# Patient Record
Sex: Male | Born: 1970
Health system: Southern US, Community
[De-identification: ages and names within clinical notes are randomized; demographics above are authoritative.]

## PROBLEM LIST (undated history)

## (undated) DIAGNOSIS — Z8719 Personal history of other diseases of the digestive system: Secondary | ICD-10-CM

## (undated) DIAGNOSIS — K859 Acute pancreatitis without necrosis or infection, unspecified: Secondary | ICD-10-CM

## (undated) HISTORY — DX: Acute pancreatitis without necrosis or infection, unspecified: K85.90

## (undated) HISTORY — PX: HERNIA REPAIR: SHX51

---

## 2015-08-22 ENCOUNTER — Encounter (HOSPITAL_COMMUNITY)
Admission: RE | Admit: 2015-08-22 | Discharge: 2015-08-22 | Disposition: A | Discharge status: Home / Self Care | Payer: Medicare Other | Source: Ambulatory Visit | Attending: Oral Surgery | Admitting: Oral Surgery

## 2015-08-22 ENCOUNTER — Encounter (HOSPITAL_COMMUNITY): Payer: Self-pay

## 2015-08-22 DIAGNOSIS — K0530 Chronic periodontitis, unspecified: Secondary | ICD-10-CM | POA: Insufficient documentation

## 2015-08-22 DIAGNOSIS — Z01812 Encounter for preprocedural laboratory examination: Secondary | ICD-10-CM | POA: Insufficient documentation

## 2015-08-22 DIAGNOSIS — K029 Dental caries, unspecified: Secondary | ICD-10-CM | POA: Diagnosis not present

## 2015-08-22 HISTORY — DX: Personal history of other diseases of the digestive system: Z87.19

## 2015-08-22 LAB — CBC
HEMATOCRIT: 43.1 % (ref 39.0–52.0)
Hemoglobin: 14.6 g/dL (ref 13.0–17.0)
MCH: 30.9 pg (ref 26.0–34.0)
MCHC: 33.9 g/dL (ref 30.0–36.0)
MCV: 91.3 fL (ref 78.0–100.0)
PLATELETS: 210 10*3/uL (ref 150–400)
RBC: 4.72 MIL/uL (ref 4.22–5.81)
RDW: 13.5 % (ref 11.5–15.5)
WBC: 8.1 10*3/uL (ref 4.0–10.5)

## 2015-08-22 NOTE — H&P (Signed)
HISTORY AND PHYSICAL  Rick Velazquez is a 44 y.o. male patient with CC: painful teeth.  No diagnosis found.  Past Medical History  Diagnosis Date  . History of hiatal hernia     No current facility-administered medications for this encounter.   Current Outpatient Prescriptions  Medication Sig Dispense Refill  . acetaminophen (TYLENOL) 500 MG tablet Take 1,000 mg by mouth every 6 (six) hours as needed for headache.     No Known Allergies Active Problems:   * No active hospital problems. *  Vitals: There were no vitals taken for this visit. Lab results: Results for orders placed or performed during the hospital encounter of 08/22/15 (from the past 24 hour(s))  CBC     Status: None   Collection Time: 08/22/15  9:12 AM  Result Value Ref Range   WBC 8.1 4.0 - 10.5 K/uL   RBC 4.72 4.22 - 5.81 MIL/uL   Hemoglobin 14.6 13.0 - 17.0 g/dL   HCT 40.9 81.1 - 91.4 %   MCV 91.3 78.0 - 100.0 fL   MCH 30.9 26.0 - 34.0 pg   MCHC 33.9 30.0 - 36.0 g/dL   RDW 78.2 95.6 - 21.3 %   Platelets 210 150 - 400 K/uL   Radiology Results: No results found. General appearance: alert and no distress Head: Normocephalic, without obvious abnormality, atraumatic Eyes: negative Nose: Nares normal. Septum midline. Mucosa normal. No drainage or sinus tenderness. Throat: Gross decay, gross calculus. Pharynx clear Neck: no adenopathy, supple, symmetrical, trachea midline and thyroid not enlarged, symmetric, no tenderness/mass/nodules Resp: clear to auscultation bilaterally Cardio: regular rate and rhythm, S1, S2 normal, no murmur, click, rub or gallop  Assessment:All teeth nonrestorable secondary to dental caries and chronic generalized periodontitis  Plan: Full mouth extractions. Alveoloplasty. General anesthesia. Day surgery.   Georgia Lopes 08/22/2015

## 2015-08-25 ENCOUNTER — Ambulatory Visit (HOSPITAL_COMMUNITY)
Admission: RE | Admit: 2015-08-25 | Discharge: 2015-08-25 | Disposition: A | Discharge status: Home / Self Care | Payer: Medicare Other | Source: Ambulatory Visit | Attending: Oral Surgery | Admitting: Oral Surgery

## 2015-08-25 ENCOUNTER — Encounter (HOSPITAL_COMMUNITY): Payer: Self-pay | Admitting: *Deleted

## 2015-08-25 ENCOUNTER — Ambulatory Visit (HOSPITAL_COMMUNITY): Payer: Medicare Other | Admitting: Anesthesiology

## 2015-08-25 ENCOUNTER — Encounter (HOSPITAL_COMMUNITY)
Admission: RE | Disposition: A | Discharge status: Home / Self Care | Payer: Self-pay | Source: Ambulatory Visit | Attending: Oral Surgery

## 2015-08-25 DIAGNOSIS — F17200 Nicotine dependence, unspecified, uncomplicated: Secondary | ICD-10-CM | POA: Diagnosis not present

## 2015-08-25 DIAGNOSIS — K029 Dental caries, unspecified: Secondary | ICD-10-CM | POA: Diagnosis not present

## 2015-08-25 DIAGNOSIS — K0532 Chronic periodontitis, generalized: Secondary | ICD-10-CM | POA: Insufficient documentation

## 2015-08-25 DIAGNOSIS — K449 Diaphragmatic hernia without obstruction or gangrene: Secondary | ICD-10-CM | POA: Diagnosis not present

## 2015-08-25 HISTORY — PX: TOOTH EXTRACTION: SHX859

## 2015-08-25 HISTORY — PX: ALVEOLOPLASTY: SHX5710

## 2015-08-25 SURGERY — DENTAL RESTORATION/EXTRACTIONS
Anesthesia: General | Site: Mouth

## 2015-08-25 MED ORDER — FENTANYL CITRATE (PF) 100 MCG/2ML IJ SOLN: 25.0000 ug | INTRAMUSCULAR | Status: DC | PRN

## 2015-08-25 MED ORDER — ONDANSETRON HCL 4 MG/2ML IJ SOLN
INTRAMUSCULAR | Status: DC | PRN
  Administered 2015-08-25: 4 mg via INTRAVENOUS

## 2015-08-25 MED ORDER — PHENYLEPHRINE HCL 10 MG/ML IJ SOLN
INTRAMUSCULAR | Status: DC | PRN
  Administered 2015-08-25: 80 ug via INTRAVENOUS
  Administered 2015-08-25: 120 ug via INTRAVENOUS
  Administered 2015-08-25: 80 ug via INTRAVENOUS

## 2015-08-25 MED ORDER — FENTANYL CITRATE (PF) 250 MCG/5ML IJ SOLN
INTRAMUSCULAR | Status: AC
  Filled 2015-08-25: qty 5

## 2015-08-25 MED ORDER — SUGAMMADEX SODIUM 200 MG/2ML IV SOLN
INTRAVENOUS | Status: DC | PRN
  Administered 2015-08-25: 200 mg via INTRAVENOUS

## 2015-08-25 MED ORDER — SUGAMMADEX SODIUM 200 MG/2ML IV SOLN
INTRAVENOUS | Status: AC
  Filled 2015-08-25: qty 2

## 2015-08-25 MED ORDER — LACTATED RINGERS IV SOLN
INTRAVENOUS | Status: DC
  Administered 2015-08-25: 50 mL/h via INTRAVENOUS

## 2015-08-25 MED ORDER — MIDAZOLAM HCL 5 MG/5ML IJ SOLN
INTRAMUSCULAR | Status: DC | PRN
  Administered 2015-08-25: 2 mg via INTRAVENOUS

## 2015-08-25 MED ORDER — PROPOFOL 10 MG/ML IV BOLUS
INTRAVENOUS | Status: DC | PRN
  Administered 2015-08-25: 200 mg via INTRAVENOUS

## 2015-08-25 MED ORDER — MIDAZOLAM HCL 2 MG/2ML IJ SOLN
INTRAMUSCULAR | Status: AC
  Filled 2015-08-25: qty 4

## 2015-08-25 MED ORDER — ROCURONIUM BROMIDE 50 MG/5ML IV SOLN
INTRAVENOUS | Status: AC
  Filled 2015-08-25: qty 1

## 2015-08-25 MED ORDER — CEFAZOLIN SODIUM-DEXTROSE 2-3 GM-% IV SOLR
INTRAVENOUS | Status: AC
  Administered 2015-08-25: 2 g via INTRAVENOUS
  Filled 2015-08-25: qty 50

## 2015-08-25 MED ORDER — PHENYLEPHRINE 40 MCG/ML (10ML) SYRINGE FOR IV PUSH (FOR BLOOD PRESSURE SUPPORT)
PREFILLED_SYRINGE | INTRAVENOUS | Status: AC
  Filled 2015-08-25: qty 10

## 2015-08-25 MED ORDER — LIDOCAINE HCL (CARDIAC) 20 MG/ML IV SOLN
INTRAVENOUS | Status: DC | PRN
  Administered 2015-08-25: 50 mg via INTRAVENOUS

## 2015-08-25 MED ORDER — ROCURONIUM BROMIDE 100 MG/10ML IV SOLN
INTRAVENOUS | Status: DC | PRN
  Administered 2015-08-25: 40 mg via INTRAVENOUS

## 2015-08-25 MED ORDER — 0.9 % SODIUM CHLORIDE (POUR BTL) OPTIME
TOPICAL | Status: DC | PRN
  Administered 2015-08-25: 1000 mL

## 2015-08-25 MED ORDER — CEFAZOLIN SODIUM-DEXTROSE 2-3 GM-% IV SOLR: 2.0000 g | INTRAVENOUS | Status: DC

## 2015-08-25 MED ORDER — SODIUM CHLORIDE 0.9 % IR SOLN
Status: DC | PRN
  Administered 2015-08-25: 1000 mL

## 2015-08-25 MED ORDER — ARTIFICIAL TEARS OP OINT
TOPICAL_OINTMENT | OPHTHALMIC | Status: DC | PRN
  Administered 2015-08-25: 1 via OPHTHALMIC

## 2015-08-25 MED ORDER — OXYCODONE-ACETAMINOPHEN 5-325 MG PO TABS: 1.0000 | ORAL_TABLET | ORAL | Status: DC | PRN

## 2015-08-25 MED ORDER — ROCURONIUM BROMIDE 50 MG/5ML IV SOLN
INTRAVENOUS | Status: AC
  Filled 2015-08-25: qty 2

## 2015-08-25 MED ORDER — LACTATED RINGERS IV SOLN
INTRAVENOUS | Status: DC | PRN
  Administered 2015-08-25 (×2): via INTRAVENOUS

## 2015-08-25 MED ORDER — ONDANSETRON HCL 4 MG/2ML IJ SOLN: 4.0000 mg | Freq: Once | INTRAMUSCULAR | Status: DC | PRN

## 2015-08-25 MED ORDER — FENTANYL CITRATE (PF) 100 MCG/2ML IJ SOLN
INTRAMUSCULAR | Status: DC | PRN
  Administered 2015-08-25: 100 ug via INTRAVENOUS
  Administered 2015-08-25: 50 ug via INTRAVENOUS

## 2015-08-25 MED ORDER — LIDOCAINE-EPINEPHRINE 2 %-1:100000 IJ SOLN
INTRAMUSCULAR | Status: DC | PRN
  Administered 2015-08-25: 20 mL via INTRADERMAL

## 2015-08-25 MED ORDER — PROPOFOL 10 MG/ML IV BOLUS
INTRAVENOUS | Status: AC
  Filled 2015-08-25: qty 20

## 2015-08-25 MED ORDER — LIDOCAINE-EPINEPHRINE 2 %-1:100000 IJ SOLN
INTRAMUSCULAR | Status: AC
  Filled 2015-08-25: qty 1

## 2015-08-25 SURGICAL SUPPLY — 29 items
BLADE 10 SAFETY STRL DISP (BLADE) IMPLANT
BUR CROSS CUT FISSURE 1.6 (BURR) ×2 IMPLANT
BUR CROSS CUT FISSURE 1.6MM (BURR) ×1
BUR EGG ELITE 4.0 (BURR) ×2 IMPLANT
BUR EGG ELITE 4.0MM (BURR) ×1
CANISTER SUCTION 2500CC (MISCELLANEOUS) ×3 IMPLANT
COVER SURGICAL LIGHT HANDLE (MISCELLANEOUS) ×3 IMPLANT
GAUZE PACKING FOLDED 2  STR (GAUZE/BANDAGES/DRESSINGS) ×2
GAUZE PACKING FOLDED 2 STR (GAUZE/BANDAGES/DRESSINGS) ×1 IMPLANT
GLOVE BIO SURGEON STRL SZ 6.5 (GLOVE) ×2 IMPLANT
GLOVE BIO SURGEON STRL SZ7.5 (GLOVE) ×3 IMPLANT
GLOVE BIO SURGEONS STRL SZ 6.5 (GLOVE) ×1
GLOVE BIOGEL PI IND STRL 7.0 (GLOVE) ×1 IMPLANT
GLOVE BIOGEL PI INDICATOR 7.0 (GLOVE) ×2
GOWN STRL REUS W/ TWL LRG LVL3 (GOWN DISPOSABLE) ×1 IMPLANT
GOWN STRL REUS W/ TWL XL LVL3 (GOWN DISPOSABLE) ×1 IMPLANT
GOWN STRL REUS W/TWL LRG LVL3 (GOWN DISPOSABLE) ×2
GOWN STRL REUS W/TWL XL LVL3 (GOWN DISPOSABLE) ×2
KIT BASIN OR (CUSTOM PROCEDURE TRAY) ×3 IMPLANT
KIT ROOM TURNOVER OR (KITS) ×3 IMPLANT
NEEDLE 22X1 1/2 (OR ONLY) (NEEDLE) ×6 IMPLANT
NS IRRIG 1000ML POUR BTL (IV SOLUTION) ×3 IMPLANT
PAD ARMBOARD 7.5X6 YLW CONV (MISCELLANEOUS) ×3 IMPLANT
SPONGE SURGIFOAM ABS GEL 12-7 (HEMOSTASIS) IMPLANT
SUT CHROMIC 3 0 PS 2 (SUTURE) ×6 IMPLANT
TOWEL OR 17X24 6PK STRL BLUE (TOWEL DISPOSABLE) IMPLANT
TRAY ENT MC OR (CUSTOM PROCEDURE TRAY) ×3 IMPLANT
TUBING IRRIGATION (MISCELLANEOUS) ×3 IMPLANT
YANKAUER SUCT BULB TIP NO VENT (SUCTIONS) ×3 IMPLANT

## 2015-08-25 NOTE — Anesthesia Preprocedure Evaluation (Addendum)
Anesthesia Evaluation  Patient identified by MRN, date of birth, ID band Patient awake    Reviewed: Allergy & Precautions, NPO status , Patient's Chart, lab work & pertinent test results  Airway Mallampati: II  TM Distance: >3 FB Neck ROM: Full    Dental  (+) Poor Dentition, Dental Advisory Given   Pulmonary Current Smoker,    breath sounds clear to auscultation       Cardiovascular  Rhythm:Regular Rate:Normal     Neuro/Psych    GI/Hepatic hiatal hernia,   Endo/Other    Renal/GU      Musculoskeletal   Abdominal   Peds  Hematology   Anesthesia Other Findings   Reproductive/Obstetrics                            Anesthesia Physical Anesthesia Plan  ASA: II  Anesthesia Plan: General   Post-op Pain Management:    Induction: Intravenous  Airway Management Planned: Nasal ETT  Additional Equipment:   Intra-op Plan:   Post-operative Plan:   Informed Consent: I have reviewed the patients History and Physical, chart, labs and discussed the procedure including the risks, benefits and alternatives for the proposed anesthesia with the patient or authorized representative who has indicated his/her understanding and acceptance.   Dental advisory given and Dental Advisory Given  Plan Discussed with: CRNA and Anesthesiologist  Anesthesia Plan Comments: (Smoker Poor dentition  Plan GA with nasal ETT  Kipp Brood)       Anesthesia Quick Evaluation

## 2015-08-25 NOTE — Op Note (Signed)
08/25/2015  12:08 PM  PATIENT:  Valerie Salts  44 y.o. male  PRE-OPERATIVE DIAGNOSIS:  NON RESTORABLE TEETH #'s 1, 2, 4, 5, 6, 8, 9, 10, 11, 12, 16, 18, 20, 21, 22, 26, 27, 28, 29, 30  POST-OPERATIVE DIAGNOSIS:  SAME  PROCEDURE:  Procedure(s): MULTIPLE EXTRACTIONS TEETH #'s 1, 2, 4, 5, 6, 8, 9, 10, 11, 12, 16, 18, 20, 21, 22, 26, 27, 28, 29, 30; ALVEOLOPLASTY MAXILLA AND MANDIBLE  SURGEON:  Surgeon(s): Ocie Doyne, DDS  ANESTHESIA:   local and general  EBL:  minimal  DRAINS: none   SPECIMEN:  No Specimen  COUNTS:  YES  PLAN OF CARE: Discharge to home after PACU  PATIENT DISPOSITION:  PACU - hemodynamically stable.   PROCEDURE DETAILS: Dictation # [id number]  Georgia Lopes, DMD 08/25/2015 12:08 PM

## 2015-08-25 NOTE — Anesthesia Procedure Notes (Signed)
Procedure Name: Intubation Date/Time: 08/25/2015 11:26 AM Performed by: Carmela Rima Pre-anesthesia Checklist: Patient being monitored, Suction available, Emergency Drugs available, Patient identified and Timeout performed Patient Re-evaluated:Patient Re-evaluated prior to inductionOxygen Delivery Method: Circle system utilized Preoxygenation: Pre-oxygenation with 100% oxygen Intubation Type: IV induction Ventilation: Mask ventilation without difficulty Laryngoscope Size: Mac and 4 Grade View: Grade II Nasal Tubes: Right, Nasal prep performed, Nasal Rae and Magill forceps- large, utilized Tube size: 7.5 mm Number of attempts: 1 Placement Confirmation: positive ETCO2,  ETT inserted through vocal cords under direct vision and breath sounds checked- equal and bilateral Tube secured with: Tape Dental Injury: Teeth and Oropharynx as per pre-operative assessment

## 2015-08-25 NOTE — Transfer of Care (Signed)
Immediate Anesthesia Transfer of Care Note  Patient: Rick Velazquez  Procedure(s) Performed: Procedure(s): MULTIPLE EXTRACTIONS OF TEETH  NUMBERS ONE, TWO, FOUR, FIVE, SIX, EIGHT, NINE, TEN, ELEVEN, TWELVE, SIXTEEN, EIGHTEEN, TWENTY, TWENTY-ONE, TWENTY-TWO, TWENTY-SIX, TWENTY-SEVEN, TWENTY-EIGHT, TWENTY-NINE, THIRTY (N/A) ALVEOLOPLASTY (N/A)  Patient Location: PACU  Anesthesia Type:General  Level of Consciousness: awake, alert  and oriented  Airway & Oxygen Therapy: Patient Spontanous Breathing and Patient connected to face mask oxygen  Post-op Assessment: Report given to RN, Post -op Vital signs reviewed and stable and Patient moving all extremities X 4  Post vital signs: Reviewed and stable  Last Vitals:  Filed Vitals:   08/25/15 0844  BP: 130/85  Pulse: 73  Temp: 36.5 C  Resp: 20    Complications: No apparent anesthesia complications

## 2015-08-25 NOTE — H&P (Signed)
HISTORY AND PHYSICAL  Rick Velazquez is a 44 y.o. male patient with CC: Painful teeth. Referred by general dentist for full mouth extractions in preparation for dentures.  No diagnosis found.  Past Medical History  Diagnosis Date  . History of hiatal hernia     Current Facility-Administered Medications  Medication Dose Route Frequency Provider Last Rate Last Dose  . ceFAZolin (ANCEF) 2-3 GM-% IVPB SOLR           . ceFAZolin (ANCEF) IVPB 2 g/50 mL premix  2 g Intravenous To SS-Surg Ocie Doyne, DDS      . lactated ringers infusion   Intravenous Continuous Kipp Brood, MD 50 mL/hr at 08/25/15 0942 50 mL/hr at 08/25/15 9604   Facility-Administered Medications Ordered in Other Encounters  Medication Dose Route Frequency Provider Last Rate Last Dose  . lactated ringers infusion    Continuous PRN Carmela Rima, CRNA       No Known Allergies Active Problems:   * No active hospital problems. *  Vitals: Blood pressure 130/85, pulse 73, temperature 97.7 F (36.5 C), temperature source Oral, resp. rate 20, height  (1.753 Velazquez), weight 80.468 kg (177 lb 6.4 oz), SpO2 100 %. Lab results:No results found for this or any previous visit (from the past 24 hour(s)). Radiology Results: No results found. General appearance: alert, cooperative and no distress Head: Normocephalic, without obvious abnormality, atraumatic Eyes: negative Nose: Nares normal. Septum midline. Mucosa normal. No drainage or sinus tenderness. Throat: grossly decayed remaining teeth. No oral lesions. No trismus. Pharynx clear. Neck: no adenopathy, supple, symmetrical, trachea midline and thyroid not enlarged, symmetric, no tenderness/mass/nodules Resp: clear to auscultation bilaterally Cardio: regular rate and rhythm, S1, S2 normal, no murmur, click, rub or gallop  Assessment: Nonrestorable teeth secondary to dental caries, periodontitis  Plan: full mouth extractions with alveoloplasty. General anesthesia. Day  surgery.   Rick Velazquez 08/25/2015

## 2015-08-25 NOTE — Anesthesia Postprocedure Evaluation (Signed)
  Anesthesia Post-op Note  Patient: Rick Velazquez  Procedure(s) Performed: Procedure(s): MULTIPLE EXTRACTIONS OF TEETH  NUMBERS ONE, TWO, FOUR, FIVE, SIX, EIGHT, NINE, TEN, ELEVEN, TWELVE, SIXTEEN, EIGHTEEN, TWENTY, TWENTY-ONE, TWENTY-TWO, TWENTY-SIX, TWENTY-SEVEN, TWENTY-EIGHT, TWENTY-NINE, THIRTY (N/A) ALVEOLOPLASTY (N/A)  Patient Location: PACU  Anesthesia Type:General  Level of Consciousness: awake, alert  and oriented  Airway and Oxygen Therapy: Patient Spontanous Breathing and Patient connected to nasal cannula oxygen  Post-op Pain: mild  Post-op Assessment: Post-op Vital signs reviewed, Patient's Cardiovascular Status Stable, Respiratory Function Stable, Patent Airway and Pain level controlled              Post-op Vital Signs: stable  Last Vitals:  Filed Vitals:   08/25/15 1330  BP: 138/88  Pulse: 85  Temp: 36.8 C  Resp: 22    Complications: No apparent anesthesia complications

## 2015-08-27 ENCOUNTER — Encounter (HOSPITAL_COMMUNITY): Payer: Self-pay | Admitting: Oral Surgery

## 2015-08-28 NOTE — Op Note (Signed)
NAME:  Rick Velazquez, Rick Velazquez NO.:  [id number]  MEDICAL RECORD NO.:  [id number]  LOCATION:  [facility]                         FACILITY:  [facility]  PHYSICIAN:  Georgia Lopes, M.D.  DATE OF BIRTH:  30-Apr-1971  DATE OF PROCEDURE:  08/25/2015 DATE OF DISCHARGE:  08/25/2015                              OPERATIVE REPORT   PREOPERATIVE DIAGNOSIS:  Nonrestorable teeth secondary to dental caries in periodontitis, #1, 2, 4, 5, 6, 8, 9, 10, 11, 12, 16, 18, 20, 21, 22, 26, 27, 28, 29, 30.  POSTOPERATIVE DIAGNOSIS:  Nonrestorable teeth secondary to dental caries in periodontitis, #1, 2, 4, 5, 6, 8, 9, 10, 11, 12, 16, 18, 20, 21, 22, 26, 27, 28, 29, 30.  PROCEDURE:  Multiple extractions teeth numbers 1, 2, 4, 6, 8, 9, 10, 11, 12, 16, 18, 20, 21, 22, 26, 27, 28, 29, 30, alveoplasty maxilla and mandible.  SURGEON:  Georgia Lopes, MD  ANESTHESIA:  General.  ATTENDING:  Georgia Lopes, MD  ANESTHESIA:  Nasal intubation.  PROCEDURE IN DETAIL:  The patient was taken to the operating room, placed on the table in supine position.  General anesthesia was administered intravenously and a nasal endotracheal tube was placed and secured.  The eyes were protected and the patient was draped.  Time-out was performed.  Posterior pharynx was suctioned.  A throat pack was placed.  A 2% lidocaine with 1:100,000 epinephrine was infiltrated in the inferior alveolar block on the right and left side and then buccal and palatal infiltration in the maxilla.  Total of 20 mL was utilized. A bite block was placed in the right side of the mouth and a Sweetheart retractor was used to retract the tongue.  A 15 blade was used to make an incision beginning at tooth #18 carrying forward along the crest of the mandibular bridge and then going buccally and lingually around teeth #20, 21, 22, and then crossing midline until #26 was encountered.  In the maxilla, a 15 blade was used to make an incision beginning  around tooth #16, carrying forward along the alveolar crest around teeth #12, 11, 10, 9, 8, and 6 on the buccal and palatal aspects of these teeth. The periosteum was reflected in the maxilla and mandible left side with a periosteal elevator.  The teeth were elevated with a 301 elevator and removed from the mouth with a dental forceps.  The sockets were curetted and then the periosteum was reflected and the gingiva was trimmed to allow for primary closure and then the alveolar crest was exposed in the maxilla and mandible on the left side.  Then, the alveoplasty was performed using the egg-shaped bur and bone file.  Then, the areas were irrigated and closed with 3-0 chromic.  A larger bite block was placed on the left side of the mouth and a Sweetheart retractor was repositioned to retract the tongue and then a 15 blade was used to make an incision beginning at tooth #30 and carried forward to tooth #27 on the buccal and lingual aspects.  Proximal incision was made approximately 1.5 cm to the additional tooth #30 and then in the maxilla, a 15 blade  was used to make an incision buccally and palatally around teeth #1, 2, 4, 5, and 6.  The periosteum was reflected from and around these teeth with a periosteal elevator.  The teeth were elevated and removed from the mouth with the dental forceps.  The sockets were curetted and then the periosteum was reflected after the gingiva was trimmed and alveoplasty was performed using the egg-shaped bur and bone file.  The areas were irrigated and closed with 3-0 chromic.  The oral cavity was inspected and found to have good contour, hemostasis, and closure.  The oral cavity was irrigated and suctioned.  Throat pack was removed.  The patient was awakened, taken to the recovery room, breathing spontaneously in good condition.  EBL:  Minimal.  COMPLICATIONS:  None.  SPECIMENS:  None.     Georgia Lopes, M.D.     SMJ/MEDQ  D:  08/25/2015  T:   08/26/2015  Job:  [id number]

## 2017-10-21 ENCOUNTER — Other Ambulatory Visit: Payer: Self-pay

## 2017-10-21 ENCOUNTER — Inpatient Hospital Stay (HOSPITAL_COMMUNITY)
Admission: EM | Admit: 2017-10-21 | Discharge: 2017-10-27 | DRG: 439 | Disposition: A | Discharge status: Home / Self Care | Payer: Medicare Other | Attending: Internal Medicine | Admitting: Internal Medicine

## 2017-10-21 ENCOUNTER — Encounter (HOSPITAL_COMMUNITY): Payer: Self-pay | Admitting: *Deleted

## 2017-10-21 DIAGNOSIS — K8050 Calculus of bile duct without cholangitis or cholecystitis without obstruction: Secondary | ICD-10-CM | POA: Diagnosis present

## 2017-10-21 DIAGNOSIS — K8020 Calculus of gallbladder without cholecystitis without obstruction: Secondary | ICD-10-CM

## 2017-10-21 DIAGNOSIS — D6489 Other specified anemias: Secondary | ICD-10-CM | POA: Diagnosis present

## 2017-10-21 DIAGNOSIS — K831 Obstruction of bile duct: Secondary | ICD-10-CM | POA: Diagnosis not present

## 2017-10-21 DIAGNOSIS — Z23 Encounter for immunization: Secondary | ICD-10-CM

## 2017-10-21 DIAGNOSIS — K5730 Diverticulosis of large intestine without perforation or abscess without bleeding: Secondary | ICD-10-CM | POA: Diagnosis not present

## 2017-10-21 DIAGNOSIS — F17210 Nicotine dependence, cigarettes, uncomplicated: Secondary | ICD-10-CM | POA: Diagnosis not present

## 2017-10-21 DIAGNOSIS — K8061 Calculus of gallbladder and bile duct with cholecystitis, unspecified, with obstruction: Secondary | ICD-10-CM | POA: Diagnosis present

## 2017-10-21 DIAGNOSIS — E876 Hypokalemia: Secondary | ICD-10-CM | POA: Diagnosis present

## 2017-10-21 DIAGNOSIS — K851 Biliary acute pancreatitis without necrosis or infection: Secondary | ICD-10-CM | POA: Diagnosis not present

## 2017-10-21 DIAGNOSIS — Z720 Tobacco use: Secondary | ICD-10-CM | POA: Diagnosis present

## 2017-10-21 LAB — URINALYSIS, ROUTINE W REFLEX MICROSCOPIC
Bacteria, UA: NONE SEEN
Glucose, UA: NEGATIVE mg/dL
Ketones, ur: NEGATIVE mg/dL
Leukocytes, UA: NEGATIVE
Nitrite: NEGATIVE
Protein, ur: 30 mg/dL — AB
SPECIFIC GRAVITY, URINE: 1.023 (ref 1.005–1.030)
pH: 5 (ref 5.0–8.0)

## 2017-10-21 LAB — COMPREHENSIVE METABOLIC PANEL
ALK PHOS: 337 U/L — AB (ref 38–126)
ALT: 202 U/L — AB (ref 17–63)
AST: 131 U/L — ABNORMAL HIGH (ref 15–41)
Albumin: 4 g/dL (ref 3.5–5.0)
Anion gap: 12 (ref 5–15)
BUN: 12 mg/dL (ref 6–20)
CALCIUM: 10 mg/dL (ref 8.9–10.3)
CO2: 26 mmol/L (ref 22–32)
CREATININE: 0.78 mg/dL (ref 0.61–1.24)
Chloride: 97 mmol/L — ABNORMAL LOW (ref 101–111)
Glucose, Bld: 185 mg/dL — ABNORMAL HIGH (ref 65–99)
Potassium: 3.2 mmol/L — ABNORMAL LOW (ref 3.5–5.1)
Sodium: 135 mmol/L (ref 135–145)
Total Bilirubin: 10.1 mg/dL — ABNORMAL HIGH (ref 0.3–1.2)
Total Protein: 7.6 g/dL (ref 6.5–8.1)

## 2017-10-21 LAB — CBC WITH DIFFERENTIAL/PLATELET
Basophils Absolute: 0 10*3/uL (ref 0.0–0.1)
Basophils Relative: 0 %
EOS PCT: 2 %
Eosinophils Absolute: 0.3 10*3/uL (ref 0.0–0.7)
HCT: 45.1 % (ref 39.0–52.0)
HEMOGLOBIN: 15 g/dL (ref 13.0–17.0)
LYMPHS ABS: 1.6 10*3/uL (ref 0.7–4.0)
LYMPHS PCT: 15 %
MCH: 31.1 pg (ref 26.0–34.0)
MCHC: 33.3 g/dL (ref 30.0–36.0)
MCV: 93.4 fL (ref 78.0–100.0)
Monocytes Absolute: 0.6 10*3/uL (ref 0.1–1.0)
Monocytes Relative: 6 %
Neutro Abs: 8.1 10*3/uL — ABNORMAL HIGH (ref 1.7–7.7)
Neutrophils Relative %: 77 %
PLATELETS: 252 10*3/uL (ref 150–400)
RBC: 4.83 MIL/uL (ref 4.22–5.81)
RDW: 14 % (ref 11.5–15.5)
WBC: 10.6 10*3/uL — AB (ref 4.0–10.5)

## 2017-10-21 LAB — LIPASE, BLOOD: LIPASE: 1237 U/L — AB (ref 11–51)

## 2017-10-21 NOTE — ED Triage Notes (Signed)
Pt with abd pain with N/V/D yesterday.

## 2017-10-22 ENCOUNTER — Inpatient Hospital Stay (HOSPITAL_COMMUNITY): Payer: Medicare Other

## 2017-10-22 ENCOUNTER — Encounter (HOSPITAL_COMMUNITY): Payer: Self-pay | Admitting: Radiology

## 2017-10-22 ENCOUNTER — Emergency Department (HOSPITAL_COMMUNITY): Payer: Medicare Other

## 2017-10-22 DIAGNOSIS — K8020 Calculus of gallbladder without cholecystitis without obstruction: Secondary | ICD-10-CM | POA: Diagnosis not present

## 2017-10-22 DIAGNOSIS — K8050 Calculus of bile duct without cholangitis or cholecystitis without obstruction: Secondary | ICD-10-CM | POA: Diagnosis not present

## 2017-10-22 DIAGNOSIS — K5730 Diverticulosis of large intestine without perforation or abscess without bleeding: Secondary | ICD-10-CM | POA: Diagnosis not present

## 2017-10-22 DIAGNOSIS — K851 Biliary acute pancreatitis without necrosis or infection: Secondary | ICD-10-CM | POA: Diagnosis present

## 2017-10-22 DIAGNOSIS — K8021 Calculus of gallbladder without cholecystitis with obstruction: Secondary | ICD-10-CM | POA: Diagnosis not present

## 2017-10-22 DIAGNOSIS — F17210 Nicotine dependence, cigarettes, uncomplicated: Secondary | ICD-10-CM | POA: Diagnosis present

## 2017-10-22 DIAGNOSIS — D6489 Other specified anemias: Secondary | ICD-10-CM | POA: Diagnosis present

## 2017-10-22 DIAGNOSIS — Z720 Tobacco use: Secondary | ICD-10-CM | POA: Diagnosis present

## 2017-10-22 DIAGNOSIS — E876 Hypokalemia: Secondary | ICD-10-CM | POA: Diagnosis present

## 2017-10-22 DIAGNOSIS — K8061 Calculus of gallbladder and bile duct with cholecystitis, unspecified, with obstruction: Secondary | ICD-10-CM | POA: Diagnosis present

## 2017-10-22 DIAGNOSIS — Z23 Encounter for immunization: Secondary | ICD-10-CM | POA: Diagnosis not present

## 2017-10-22 LAB — LIPID PANEL
CHOL/HDL RATIO: 5.8 ratio
CHOLESTEROL: 208 mg/dL — AB (ref 0–200)
HDL: 36 mg/dL — ABNORMAL LOW (ref >40)
LDL Cholesterol: 146 mg/dL — ABNORMAL HIGH (ref 0–99)
Triglycerides: 132 mg/dL (ref <150)
VLDL: 26 mg/dL (ref 0–40)

## 2017-10-22 LAB — COMPREHENSIVE METABOLIC PANEL
ALK PHOS: 262 U/L — AB (ref 38–126)
ALT: 153 U/L — ABNORMAL HIGH (ref 17–63)
ANION GAP: 9 (ref 5–15)
AST: 80 U/L — ABNORMAL HIGH (ref 15–41)
Albumin: 3.4 g/dL — ABNORMAL LOW (ref 3.5–5.0)
BUN: 11 mg/dL (ref 6–20)
CALCIUM: 8.3 mg/dL — AB (ref 8.9–10.3)
CHLORIDE: 107 mmol/L (ref 101–111)
CO2: 22 mmol/L (ref 22–32)
Creatinine, Ser: 0.87 mg/dL (ref 0.61–1.24)
GFR calc non Af Amer: >60 mL/min (ref >60)
Glucose, Bld: 138 mg/dL — ABNORMAL HIGH (ref 65–99)
POTASSIUM: 4.1 mmol/L (ref 3.5–5.1)
SODIUM: 138 mmol/L (ref 135–145)
Total Bilirubin: 4.7 mg/dL — ABNORMAL HIGH (ref 0.3–1.2)
Total Protein: 6.3 g/dL — ABNORMAL LOW (ref 6.5–8.1)

## 2017-10-22 LAB — CBC WITH DIFFERENTIAL/PLATELET
Basophils Absolute: 0 10*3/uL (ref 0.0–0.1)
Basophils Relative: 0 %
EOS ABS: 0 10*3/uL (ref 0.0–0.7)
EOS PCT: 0 %
HCT: 45 % (ref 39.0–52.0)
Hemoglobin: 14.7 g/dL (ref 13.0–17.0)
LYMPHS ABS: 0.8 10*3/uL (ref 0.7–4.0)
Lymphocytes Relative: 7 %
MCH: 30.9 pg (ref 26.0–34.0)
MCHC: 32.7 g/dL (ref 30.0–36.0)
MCV: 94.7 fL (ref 78.0–100.0)
MONO ABS: 0.6 10*3/uL (ref 0.1–1.0)
MONOS PCT: 6 %
Neutro Abs: 9.4 10*3/uL — ABNORMAL HIGH (ref 1.7–7.7)
Neutrophils Relative %: 87 %
PLATELETS: 215 10*3/uL (ref 150–400)
RBC: 4.75 MIL/uL (ref 4.22–5.81)
RDW: 14.3 % (ref 11.5–15.5)
WBC: 10.9 10*3/uL — ABNORMAL HIGH (ref 4.0–10.5)

## 2017-10-22 LAB — MAGNESIUM: Magnesium: 2 mg/dL (ref 1.7–2.4)

## 2017-10-22 LAB — I-STAT CG4 LACTIC ACID, ED: LACTIC ACID, VENOUS: 0.99 mmol/L (ref 0.5–1.9)

## 2017-10-22 LAB — LIPASE, BLOOD: Lipase: 452 U/L — ABNORMAL HIGH (ref 11–51)

## 2017-10-22 LAB — BILIRUBIN, DIRECT: BILIRUBIN DIRECT: 2.5 mg/dL — AB (ref 0.1–0.5)

## 2017-10-22 MED ORDER — IOPAMIDOL (ISOVUE-300) INJECTION 61%
100.0000 mL | Freq: Once | INTRAVENOUS | Status: AC | PRN
  Administered 2017-10-22: 100 mL via INTRAVENOUS

## 2017-10-22 MED ORDER — MORPHINE SULFATE (PF) 4 MG/ML IV SOLN
4.0000 mg | INTRAVENOUS | Status: DC | PRN
  Administered 2017-10-22 – 2017-10-27 (×19): 4 mg via INTRAVENOUS
  Filled 2017-10-22 (×21): qty 1

## 2017-10-22 MED ORDER — FLEET ENEMA 7-19 GM/118ML RE ENEM
1.0000 | ENEMA | Freq: Once | RECTAL | Status: AC
  Administered 2017-10-22: 1 via RECTAL

## 2017-10-22 MED ORDER — INFLUENZA VAC SPLIT QUAD 0.5 ML IM SUSY
0.5000 mL | PREFILLED_SYRINGE | INTRAMUSCULAR | Status: AC
  Administered 2017-10-23: 0.5 mL via INTRAMUSCULAR
  Filled 2017-10-22: qty 0.5

## 2017-10-22 MED ORDER — MORPHINE SULFATE (PF) 2 MG/ML IV SOLN
INTRAVENOUS | Status: AC
  Administered 2017-10-22: 4 mg via INTRAVENOUS
  Filled 2017-10-22: qty 2

## 2017-10-22 MED ORDER — POTASSIUM CHLORIDE IN NACL 20-0.9 MEQ/L-% IV SOLN
INTRAVENOUS | Status: DC
  Administered 2017-10-22 – 2017-10-26 (×14): via INTRAVENOUS

## 2017-10-22 MED ORDER — PNEUMOCOCCAL VAC POLYVALENT 25 MCG/0.5ML IJ INJ
0.5000 mL | INJECTION | INTRAMUSCULAR | Status: AC
  Administered 2017-10-23: 0.5 mL via INTRAMUSCULAR
  Filled 2017-10-22: qty 0.5

## 2017-10-22 MED ORDER — MORPHINE SULFATE (PF) 4 MG/ML IV SOLN
4.0000 mg | Freq: Once | INTRAVENOUS | Status: AC
  Administered 2017-10-22: 4 mg via INTRAVENOUS
  Filled 2017-10-22: qty 1

## 2017-10-22 MED ORDER — CEFTRIAXONE SODIUM 1 G IJ SOLR
1.0000 g | INTRAMUSCULAR | Status: DC
  Administered 2017-10-22 – 2017-10-23 (×2): 1 g via INTRAVENOUS
  Filled 2017-10-22 (×3): qty 10

## 2017-10-22 MED ORDER — ONDANSETRON HCL 4 MG/2ML IJ SOLN
4.0000 mg | Freq: Once | INTRAMUSCULAR | Status: AC
Start: 2017-10-22 — End: 2017-10-22
  Administered 2017-10-22: 4 mg via INTRAVENOUS
  Filled 2017-10-22: qty 2

## 2017-10-22 MED ORDER — PANTOPRAZOLE SODIUM 40 MG IV SOLR
40.0000 mg | INTRAVENOUS | Status: DC
  Administered 2017-10-22 – 2017-10-23 (×2): 40 mg via INTRAVENOUS
  Filled 2017-10-22 (×2): qty 40

## 2017-10-22 MED ORDER — SODIUM CHLORIDE 0.9 % IV SOLN: INTRAVENOUS | Status: DC

## 2017-10-22 MED ORDER — POTASSIUM CHLORIDE IN NACL 20-0.9 MEQ/L-% IV SOLN
INTRAVENOUS | Status: AC
  Administered 2017-10-22 (×2): 1000 mL via INTRAVENOUS
  Filled 2017-10-22 (×3): qty 1000

## 2017-10-22 MED ORDER — ONDANSETRON HCL 4 MG/2ML IJ SOLN
4.0000 mg | Freq: Four times a day (QID) | INTRAMUSCULAR | Status: DC | PRN
  Administered 2017-10-22 – 2017-10-24 (×8): 4 mg via INTRAVENOUS
  Filled 2017-10-22 (×8): qty 2

## 2017-10-22 MED ORDER — NICOTINE 21 MG/24HR TD PT24
21.0000 mg | MEDICATED_PATCH | Freq: Every day | TRANSDERMAL | Status: DC | PRN
  Administered 2017-10-22: 21 mg via TRANSDERMAL
  Filled 2017-10-22: qty 1

## 2017-10-22 NOTE — ED Notes (Signed)
Pt ambulated with assistance to BR  

## 2017-10-22 NOTE — ED Notes (Signed)
Report given to chris, rn

## 2017-10-22 NOTE — ED Provider Notes (Signed)
[facility] EMERGENCY DEPARTMENT Provider Note   CSN: [id number] Arrival date & time: 10/21/17  2037     History   Chief Complaint Chief Complaint  Patient presents with  . Abdominal Pain    HPI EMRIC KOWALEWSKI is a 46 y.o. male presenting with N/V and abd pain.   Patient states yesterday he had nausea and vomiting, was unable to keep anything down.  This morning, he was able to tolerate water and some toast.  This evening he tried to eat a bologna sandwich, and had return of nausea and vomiting.  Since then, he has had persistent abdominal pain.  He describes it as generalized, occasionally radiating to the back.  It is not worse in one location.  It is constant and sharp.  He denies fevers, chills, chest pain, or shortness of breath.  He reports decreased urine output today and no change in bowel movements.  He had 800 mg of ibuprofen yesterday, which improved his pain, and 500 mg of Tylenol today which did not improve his pain.  He has not tried anything else for pain.  He denies starting new medicines recently, denies alcohol use, denies drug use, denies history of issues with his pancreas or gallbladder.   HPI  Past Medical History:  Diagnosis Date  . History of hiatal hernia     There are no active problems to display for this patient.   Past Surgical History:  Procedure Laterality Date  . ALVEOLOPLASTY N/A 08/25/2015   Procedure: ALVEOLOPLASTY;  Surgeon: Ocie Doyne, DDS;  Location: [facility] OR;  Service: Oral Surgery;  Laterality: N/A;  . HERNIA REPAIR    . TOOTH EXTRACTION N/A 08/25/2015   Procedure: MULTIPLE EXTRACTIONS OF TEETH  NUMBERS ONE, TWO, FOUR, FIVE, SIX, EIGHT, NINE, TEN, ELEVEN, TWELVE, SIXTEEN, EIGHTEEN, TWENTY, TWENTY-ONE, TWENTY-TWO, TWENTY-SIX, TWENTY-SEVEN, TWENTY-EIGHT, TWENTY-NINE, THIRTY;  Surgeon: Ocie Doyne, DDS;  Location: [facility] OR;  Service: Oral Surgery;  Laterality: N/A;       Home Medications    Prior to Admission medications   Medication Sig  Start Date End Date Taking? Authorizing Provider  oxyCODONE-acetaminophen (PERCOCET) 5-325 MG tablet Take 1-2 tablets by mouth every 4 (four) hours as needed for severe pain. 08/25/15   Ocie Doyne, DDS    Family History History reviewed. No pertinent family history.  Social History Social History   Tobacco Use  . Smoking status: Current Every Day Smoker    Packs/day: 2.00    Years: 25.00    Pack years: 50.00    Types: Cigarettes  . Smokeless tobacco: Never Used  Substance Use Topics  . Alcohol use: Yes    Comment: social  . Drug use: Not on file    Comment: quit 15 yrs ago     Allergies   Patient has no known allergies.   Review of Systems Review of Systems  Gastrointestinal: Positive for abdominal pain, nausea and vomiting.  All other systems reviewed and are negative.    Physical Exam Updated Vital Signs BP (!) 125/99   Pulse (!) 105   Temp 97.7 F (36.5 C) (Oral)   Resp 18   Ht 5\' 9"  (1.753 m)   Wt 72.6 kg (160 lb)   SpO2 99%   BMI 23.63 kg/m   Physical Exam  Constitutional: He appears well-developed and well-nourished.  Pt appears uncomfortable due to pain.  Patient appears jaundiced.  HENT:  Head: Normocephalic and atraumatic.  Eyes: EOM are normal.  Neck: Normal range of motion.  Cardiovascular:  Regular rhythm and intact distal pulses.  Slightly tachycardic at low 100s  Pulmonary/Chest: Effort normal and breath sounds normal. No respiratory distress. He has no wheezes.  Abdominal: Soft. He exhibits no distension. There is tenderness. There is guarding (voluntary).  TTP of abd with increased pain epigastrically.   Musculoskeletal: Normal range of motion.  Neurological: He is alert.  Skin: Skin is warm.  Psychiatric: He has a normal mood and affect.  Nursing note and vitals reviewed.    ED Treatments / Results  Labs (all labs ordered are listed, but only abnormal results are displayed) Labs Reviewed  CBC WITH DIFFERENTIAL/PLATELET -  Abnormal; Notable for the following components:      Result Value   WBC 10.6 (*)    Neutro Abs 8.1 (*)    All other components within normal limits  COMPREHENSIVE METABOLIC PANEL - Abnormal; Notable for the following components:   Potassium 3.2 (*)    Chloride 97 (*)    Glucose, Bld 185 (*)    AST 131 (*)    ALT 202 (*)    Alkaline Phosphatase 337 (*)    Total Bilirubin 10.1 (*)    All other components within normal limits  LIPASE, BLOOD - Abnormal; Notable for the following components:   Lipase 1,237 (*)    All other components within normal limits  URINALYSIS, ROUTINE W REFLEX MICROSCOPIC - Abnormal; Notable for the following components:   Color, Urine AMBER (*)    Hgb urine dipstick SMALL (*)    Bilirubin Urine MODERATE (*)    Protein, ur 30 (*)    Squamous Epithelial / LPF 0-5 (*)    All other components within normal limits  I-STAT CG4 LACTIC ACID, ED    EKG  EKG Interpretation None       Radiology Ct Abdomen Pelvis W Contrast  Result Date: 10/22/2017 CLINICAL DATA:  Generalized abdominal pain with nausea and vomiting EXAM: CT ABDOMEN AND PELVIS WITH CONTRAST TECHNIQUE: Multidetector CT imaging of the abdomen and pelvis was performed using the standard protocol following bolus administration of intravenous contrast. CONTRAST:  100mL ISOVUE-300 IOPAMIDOL (ISOVUE-300) INJECTION 61% COMPARISON:  None. FINDINGS: Lower chest: Lung bases demonstrate patchy dependent atelectasis. Normal heart size. Small hiatal hernia with thickening. Hepatobiliary: Mild intra hepatic biliary dilatation. Multiple calcified gallstones. Possible mild gallbladder wall thickening. Enlarged extrahepatic bile duct, measuring up to 11 mm at the head of the pancreas. Possible punctate stones near the duct insertion, series 2, image number 37. Pancreas: Enlarged appearing pancreas. Moderate-to-marked fluid, soft tissue stranding and edema cephalad to the pancreas, suspect for pancreatitis. No focal  pancreatic hypoenhancement. Slightly more focal fluid collection adjacent to the fundus of the stomach but without rim enhancement. Spleen: Normal in size without focal abnormality. Adrenals/Urinary Tract: Adrenal glands are unremarkable. Kidneys are normal, without renal calculi, focal lesion, or hydronephrosis. Bladder is unremarkable. Stomach/Bowel: Stomach is nonenlarged. No dilated small bowel. Sigmoid colon diverticula, no definitive inflammation. Normal appendix Vascular/Lymphatic: Aortic atherosclerosis. No aneurysmal dilatation. Normal enhancement of splenic and portal vessels at this time. Reproductive: Prostate is unremarkable. Other: Negative for free air. Small amount of free fluid in the pelvis. Fluid extending inferiorly within the left anterior pararenal space, and anterior to the psoas muscle. Musculoskeletal: Degenerative changes. No acute or suspicious lesion IMPRESSION: 1. Moderate fluid, edema and inflammation within the upper abdomen, felt most consistent with acute pancreatitis. Slightly more focal fluid is seen adjacent to the fundus of the stomach but without rim enhancement at this  time. 2. Cholelithiasis. Mild intra hepatic and moderate-to-marked extrahepatic biliary dilatation. Suspect punctate stones within the distal duct. Recommend correlation with LFTs. 3. Small amount of free fluid in the pelvis. 4. Sigmoid colon diverticula without acute inflammation Electronically Signed   By: Jasmine PangKim  Fujinaga M.D.   On: 10/22/2017 01:25    Procedures Procedures (including critical care time)  Medications Ordered in ED Medications  ondansetron (ZOFRAN) injection 4 mg (4 mg Intravenous Given 10/22/17 0028)  morphine 4 MG/ML injection 4 mg (4 mg Intravenous Given 10/22/17 0028)  iopamidol (ISOVUE-300) 61 % injection 100 mL (100 mLs Intravenous Contrast Given 10/22/17 0043)     Initial Impression / Assessment and Plan / ED Course  I have reviewed the triage vital signs and the nursing  notes.  Pertinent labs & imaging results that were available during my care of the patient were reviewed by me and considered in my medical decision making (see chart for details).     Patient presenting for evaluation of nausea, vomiting, abdominal pain.  Physical exam shows patient is slightly tachycardic around 105.  He appears jaundiced and uncomfortable due to pain.  Tenderness of abdomen, worse epigastrically.  Initial labs showed lipase of 1200, increased bilirubin, and increased liver enzymes.  White count slightly elevated at 10.6.  Fluid bolus running.  Will give Zofran and morphine for pain control.  CT ordered for further evaluation.  Case discussed with attending, Dr. Preston FleetingGlick evaluated the patient.   CT shows pancreatitis and concern for gallstones in the distal duct.  On reassessment, patient states pain is improved.  Will keep n.p.o.  Case discussed with hospitalist, patient to be admitted for pain control and further management.   Final Clinical Impressions(s) / ED Diagnoses   Final diagnoses:  Gallstone pancreatitis    ED Discharge Orders    None       Alveria ApleyCaccavale, Izaah Westman, PA-C 10/22/17 0148    Dione BoozeGlick, David, MD 10/22/17 [phone]

## 2017-10-22 NOTE — ED Notes (Signed)
Patient transported to Ultrasound 

## 2017-10-22 NOTE — H&P (Signed)
History and Physical    ALVIN DIFFEE ONG:[id number] DOB: August 11, 1971 DOA: 10/21/2017  PCP: System, Pcp Not In   Patient coming from: Home.  I have personally briefly reviewed patient's old medical records in [facility]  Chief Complaint: Abdominal pain.  HPI: TREVAN MESSMAN is a 46 y.o. male with medical history significant of hiatal hernia, tobacco use disorder who is coming to the emergency department with complaints of  several days of epigastric abdominal pain associated with nausea, vomiting and diarrhea.  No fever, no chills.  Patient does not elaborate too much on his symptoms.  He denies alcohol use, except on social occasions.  No fever, no chills, chest pain, dyspnea, palpitations, diaphoresis, pitting edema of the lower extremities, flank pain, dysuria, frequency or hematuria.  Patient is not a very good historian and seemed to be a little bit in shock, when I asked him how long he had been jaundiced.  Apparently he did not know that he was having jaundice.  ED Course: Initial vital signs temperature 97.51F, pulse 105, respirations 18, blood pressure 131/95 mmHg and O2 sat 98% on room air.  He received fluids and analgesics in the emergency department.  His workup shows an urinalysis with small hemoglobinuria and moderate bilirubinuria.  WBC of 10.6 with 77.7 neutrophils, hemoglobin of 15 and platelets of 252.  Chemistry showed lactic acid of 0.99 sodium of 135, potassium 3.2, chloride 97, CO2 26 millimol/L.  Glucose was 185 magnesium 2.0 and total bilirubin 10.1 mg/dL.  Lipase level was 1237.  AST 131, ALT 202 and alkaline phosphatase 337 u/L.  CT abdomen/pelvis with contrast. IMPRESSION: 1. Moderate fluid, edema and inflammation within the upper abdomen, felt most consistent with acute pancreatitis. Slightly more focal fluid is seen adjacent to the fundus of the stomach but without rim enhancement at this time. 2. Cholelithiasis. Mild intra hepatic and  moderate-to-marked extrahepatic biliary dilatation. Suspect punctate stones within the distal duct. Recommend correlation with LFTs. 3. Small amount of free fluid in the pelvis. 4. Sigmoid colon diverticula without acute inflammation    Review of Systems: As per HPI otherwise 10 point review of systems negative.   Past Medical History:  Diagnosis Date  . History of hiatal hernia     Past Surgical History:  Procedure Laterality Date  . ALVEOLOPLASTY N/A 08/25/2015   Procedure: ALVEOLOPLASTY;  Surgeon: Ocie Doyne, DDS;  Location: [facility] OR;  Service: Oral Surgery;  Laterality: N/A;  . HERNIA REPAIR    . TOOTH EXTRACTION N/A 08/25/2015   Procedure: MULTIPLE EXTRACTIONS OF TEETH  NUMBERS ONE, TWO, FOUR, FIVE, SIX, EIGHT, NINE, TEN, ELEVEN, TWELVE, SIXTEEN, EIGHTEEN, TWENTY, TWENTY-ONE, TWENTY-TWO, TWENTY-SIX, TWENTY-SEVEN, TWENTY-EIGHT, TWENTY-NINE, THIRTY;  Surgeon: Ocie Doyne, DDS;  Location: [facility] OR;  Service: Oral Surgery;  Laterality: N/A;     reports that he has been smoking cigarettes.  He has a 50.00 pack-year smoking history. he has never used smokeless tobacco. He reports that he drinks alcohol. His drug history is not on file.  No Known Allergies  Family History  Problem Relation Age of Onset  . Diabetes Father     Prior to Admission medications   Medication Sig Start Date End Date Taking? Authorizing Provider  oxyCODONE-acetaminophen (PERCOCET) 5-325 MG tablet Take 1-2 tablets by mouth every 4 (four) hours as needed for severe pain. 08/25/15   Ocie Doyne, DDS    Physical Exam: Vitals:   10/22/17 0030 10/22/17 0200 10/22/17 0230 10/22/17 0306  BP: (!) 125/99 (!) 122/92 Marland Kitchen)  121/93 (!) 124/98  Pulse: (!) 105 (!) 102 (!) 102 98  Resp:    16  Temp:      TempSrc:      SpO2: 99% 95% 97% 97%  Weight:      Height:        Constitutional: Mildly anxious, but otherwise in no acute distress. Eyes: PERRL, icteric sclerae, lids and conjunctivae normal ENMT: Mucous  membranes are mildly dry.  Posterior pharynx clear of any exudate or lesions. Poor state of repair of dentition.  Neck: normal, supple, no masses, no thyromegaly Respiratory: clear to auscultation bilaterally, no wheezing, no crackles. Normal respiratory effort. No accessory muscle use.  Cardiovascular: Regular rate and rhythm, no murmurs / rubs / gallops. No extremity edema. 2+ pedal pulses. No carotid bruits.  Abdomen: Soft, positive diffuse tenderness with guarding, no rebound/masses palpated. No hepatosplenomegaly. Bowel sounds positive.  Musculoskeletal: no clubbing / cyanosis. . Good ROM, no contractures. Normal muscle tone.  Skin: Jaundice. Neurologic: CN 2-12 grossly intact. Sensation intact, DTR normal. Strength 5/5 in all 4.  Psychiatric: Normal judgment and insight. Alert and oriented x 4.  Mildly anxious mood.    Labs on Admission: I have personally reviewed following labs and imaging studies  CBC: Recent Labs  Lab 10/21/17 2104  WBC 10.6*  NEUTROABS 8.1*  HGB 15.0  HCT 45.1  MCV 93.4  PLT 252   Basic Metabolic Panel: Recent Labs  Lab 10/21/17 2104  NA 135  K 3.2*  CL 97*  CO2 26  GLUCOSE 185*  BUN 12  CREATININE 0.78  CALCIUM 10.0  MG 2.0   GFR: Estimated Creatinine Clearance: 115.4 mL/min (by C-G formula based on SCr of 0.78 mg/dL). Liver Function Tests: Recent Labs  Lab 10/21/17 2104  AST 131*  ALT 202*  ALKPHOS 337*  BILITOT 10.1*  PROT 7.6  ALBUMIN 4.0   Recent Labs  Lab 10/21/17 2104  LIPASE 1,237*   No results for input(s): AMMONIA in the last 168 hours. Coagulation Profile: No results for input(s): INR, PROTIME in the last 168 hours. Cardiac Enzymes: No results for input(s): CKTOTAL, CKMB, CKMBINDEX, TROPONINI in the last 168 hours. BNP (last 3 results) No results for input(s): PROBNP in the last 8760 hours. HbA1C: No results for input(s): HGBA1C in the last 72 hours. CBG: No results for input(s): GLUCAP in the last 168  hours. Lipid Profile: No results for input(s): CHOL, HDL, LDLCALC, TRIG, CHOLHDL, LDLDIRECT in the last 72 hours. Thyroid Function Tests: No results for input(s): TSH, T4TOTAL, FREET4, T3FREE, THYROIDAB in the last 72 hours. Anemia Panel: No results for input(s): VITAMINB12, FOLATE, FERRITIN, TIBC, IRON, RETICCTPCT in the last 72 hours. Urine analysis:    Component Value Date/Time   COLORURINE AMBER (A) 10/21/2017 2110   APPEARANCEUR CLEAR 10/21/2017 2110   LABSPEC 1.023 10/21/2017 2110   PHURINE 5.0 10/21/2017 2110   GLUCOSEU NEGATIVE 10/21/2017 2110   HGBUR SMALL (A) 10/21/2017 2110   BILIRUBINUR MODERATE (A) 10/21/2017 2110   KETONESUR NEGATIVE 10/21/2017 2110   PROTEINUR 30 (A) 10/21/2017 2110   NITRITE NEGATIVE 10/21/2017 2110   LEUKOCYTESUR NEGATIVE 10/21/2017 2110    Radiological Exams on Admission: Ct Abdomen Pelvis W Contrast  Result Date: 10/22/2017 CLINICAL DATA:  Generalized abdominal pain with nausea and vomiting EXAM: CT ABDOMEN AND PELVIS WITH CONTRAST TECHNIQUE: Multidetector CT imaging of the abdomen and pelvis was performed using the standard protocol following bolus administration of intravenous contrast. CONTRAST:  100mL ISOVUE-300 IOPAMIDOL (ISOVUE-300) INJECTION 61% COMPARISON:  None. FINDINGS: Lower chest: Lung bases demonstrate patchy dependent atelectasis. Normal heart size. Small hiatal hernia with thickening. Hepatobiliary: Mild intra hepatic biliary dilatation. Multiple calcified gallstones. Possible mild gallbladder wall thickening. Enlarged extrahepatic bile duct, measuring up to 11 mm at the head of the pancreas. Possible punctate stones near the duct insertion, series 2, image number 37. Pancreas: Enlarged appearing pancreas. Moderate-to-marked fluid, soft tissue stranding and edema cephalad to the pancreas, suspect for pancreatitis. No focal pancreatic hypoenhancement. Slightly more focal fluid collection adjacent to the fundus of the stomach but without  rim enhancement. Spleen: Normal in size without focal abnormality. Adrenals/Urinary Tract: Adrenal glands are unremarkable. Kidneys are normal, without renal calculi, focal lesion, or hydronephrosis. Bladder is unremarkable. Stomach/Bowel: Stomach is nonenlarged. No dilated small bowel. Sigmoid colon diverticula, no definitive inflammation. Normal appendix Vascular/Lymphatic: Aortic atherosclerosis. No aneurysmal dilatation. Normal enhancement of splenic and portal vessels at this time. Reproductive: Prostate is unremarkable. Other: Negative for free air. Small amount of free fluid in the pelvis. Fluid extending inferiorly within the left anterior pararenal space, and anterior to the psoas muscle. Musculoskeletal: Degenerative changes. No acute or suspicious lesion IMPRESSION: 1. Moderate fluid, edema and inflammation within the upper abdomen, felt most consistent with acute pancreatitis. Slightly more focal fluid is seen adjacent to the fundus of the stomach but without rim enhancement at this time. 2. Cholelithiasis. Mild intra hepatic and moderate-to-marked extrahepatic biliary dilatation. Suspect punctate stones within the distal duct. Recommend correlation with LFTs. 3. Small amount of free fluid in the pelvis. 4. Sigmoid colon diverticula without acute inflammation Electronically Signed   By: Jasmine PangKim  Fujinaga M.D.   On: 10/22/2017 01:25    EKG: Independently reviewed.  Assessment/Plan Principal Problem:   Acute gallstone pancreatitis Admit to MedSurg/inpatient. Keep n.p.o. Continue IV fluids with potassium supplementation. Morphine sulfate 4 mg every 3 hours as needed. Zofran 4 mg every 6 hours as needed for nausea. Follow-up CBC and CMP. Get ultrasound of the right upper quadrant in the morning. Consult gastroenterology. Most likely will need MRCP or ERCP.  Active Problems:   Hypokalemia Replacing. Follow-up potassium level.    Tobacco use Nicotine replacement therapy ordered. Tobacco  cessation information to be provided by the staff.    DVT prophylaxis: SCDs. Code Status: Full code. Family Communication:  Disposition Plan: Admit for pain control, further evaluation and GI consult. Consults called: Routine gastroenterology consult. Admission status: Inpatient/MedSurg.   Bobette Moavid Manuel Elenora Hawbaker MD [facility] Pager [phone].  If 7PM-7AM, please contact night-coverage [email or web address] Password [email or web address]  10/22/2017, 5:47 AM

## 2017-10-22 NOTE — Progress Notes (Signed)
10/21/2017 10:17 PM  10/22/2017 1:40 PM  Rick Velazquez was seen and examined.  The H&P by the admitting provider, orders, imaging was reviewed.  Please see new orders.  Will continue to follow.   Maryln Manuel. Joselito Fieldhouse, MD [facility]

## 2017-10-22 NOTE — ED Notes (Signed)
Pt complaining of abdominal pain. Pt having dry heaves in the room. Pt complexion & scalar have a yellow tent to them.

## 2017-10-22 NOTE — Consult Note (Signed)
Reason for Consult:gallstone pancreatitis Referring Physician:  [name] is an 46 y.o. male.  HPI: Admitted thru the ED. Presented with diffuse abdominal pain, nausea and vomiting. No fever. Unable to keep and liquids or solids down. Liver enzymes elevated. Bilirubin 4.7.  Underwent a CT which revealed 1. Moderate fluid, edema and inflammation within the upper abdomen, felt most consistent with acute pancreatitis. Slightly more focal fluid is seen adjacent to the fundus of the stomach but without rim enhancement at this time. 2. Cholelithiasis. Mild intra hepatic and moderate-to-marked extrahepatic biliary dilatation. Suspect punctate stones within the distal duct. Recommend correlation with LFTs. 3. Small amount of free fluid in the pelvis. 4. Sigmoid colon diverticula without acute inflammation  [place] RUQ revealed: Cholelithiasis.  Intra and extrahepatic biliary dilatation suggest distal biliary obstruction. MRCP or ERCP can be performed to further delineate.  Heterogeneous liver. Diffuse hepatic parenchymal disease cannot be excluded. Correlation with liver function tests is recommended.  No etoh or drugs in 15 yrs.  Hepatic Function Latest Ref Rng & Units 10/22/2017 10/21/2017  Total Protein 6.5 - 8.1 g/dL 6.3(L) 7.6  Albumin 3.5 - 5.0 g/dL 3.4(L) 4.0  AST 15 - 41 U/L 80(H) 131(H)  ALT 17 - 63 U/L 153(H) 202(H)  Alk Phosphatase 38 - 126 U/L 262(H) 337(H)  Total Bilirubin 0.3 - 1.2 mg/dL 4.7(H) 10.1(H)  Bilirubin, Direct 0.1 - 0.5 mg/dL 2.5(H) -   Lipase     Component Value Date/Time   LIPASE 452 (H) 10/22/2017 0728   Amylase No results found for: AMYLASE     Past Medical History:  Diagnosis Date  . History of hiatal hernia     Past Surgical History:  Procedure Laterality Date  . ALVEOLOPLASTY N/A 08/25/2015   Procedure: ALVEOLOPLASTY;  Surgeon: Diona Browner, DDS;  Location: [place];  Service: Oral Surgery;  Laterality: N/A;  . HERNIA REPAIR    . TOOTH  EXTRACTION N/A 08/25/2015   Procedure: MULTIPLE EXTRACTIONS OF TEETH  NUMBERS ONE, TWO, FOUR, FIVE, SIX, EIGHT, NINE, TEN, ELEVEN, TWELVE, SIXTEEN, EIGHTEEN, TWENTY, TWENTY-ONE, TWENTY-TWO, TWENTY-SIX, TWENTY-SEVEN, TWENTY-EIGHT, TWENTY-NINE, THIRTY;  Surgeon: Diona Browner, DDS;  Location: [facility];  Service: Oral Surgery;  Laterality: N/A;    Family History  Problem Relation Age of Onset  . Diabetes Father     Social History:  reports that he has been smoking cigarettes.  He has a 50.00 pack-year smoking history. he has never used smokeless tobacco. He reports that he drinks alcohol. His drug history is not on file.  Allergies: No Known Allergies  Medications: I have reviewed the patient's current medications.  Results for orders placed or performed during the hospital encounter of 10/21/17 (from the past 48 hour(s))  CBC with Differential     Status: Abnormal   Collection Time: 10/21/17  9:04 PM  Result Value Ref Range   WBC 10.6 (H) 4.0 - 10.5 K/uL   RBC 4.83 4.22 - 5.81 MIL/uL   Hemoglobin 15.0 13.0 - 17.0 g/dL   HCT 45.1 39.0 - 52.0 %   MCV 93.4 78.0 - 100.0 fL   MCH 31.1 26.0 - 34.0 pg   MCHC 33.3 30.0 - 36.0 g/dL   RDW 14.0 11.5 - 15.5 %   Platelets 252 150 - 400 K/uL   Neutrophils Relative % 77 %   Neutro Abs 8.1 (H) 1.7 - 7.7 K/uL   Lymphocytes Relative 15 %   Lymphs Abs 1.6 0.7 - 4.0 K/uL   Monocytes Relative 6 %  Monocytes Absolute 0.6 0.1 - 1.0 K/uL   Eosinophils Relative 2 %   Eosinophils Absolute 0.3 0.0 - 0.7 K/uL   Basophils Relative 0 %   Basophils Absolute 0.0 0.0 - 0.1 K/uL  Comprehensive metabolic panel     Status: Abnormal   Collection Time: 10/21/17  9:04 PM  Result Value Ref Range   Sodium 135 135 - 145 mmol/L   Potassium 3.2 (L) 3.5 - 5.1 mmol/L   Chloride 97 (L) 101 - 111 mmol/L   CO2 26 22 - 32 mmol/L   Glucose, Bld 185 (H) 65 - 99 mg/dL   BUN 12 6 - 20 mg/dL   Creatinine, Ser 0.78 0.61 - 1.24 mg/dL   Calcium 10.0 8.9 - 10.3 mg/dL   Total  Protein 7.6 6.5 - 8.1 g/dL   Albumin 4.0 3.5 - 5.0 g/dL   AST 131 (H) 15 - 41 U/L   ALT 202 (H) 17 - 63 U/L   Alkaline Phosphatase 337 (H) 38 - 126 U/L   Total Bilirubin 10.1 (H) 0.3 - 1.2 mg/dL   GFR calc non Af Amer >60 >60 mL/min   GFR calc Af Amer >60 >60 mL/min    Comment: (NOTE) The eGFR has been calculated using the CKD EPI equation. This calculation has not been validated in all clinical situations. eGFR's persistently <60 mL/min signify possible Chronic Kidney Disease.    Anion gap 12 5 - 15  Lipase, blood     Status: Abnormal   Collection Time: 10/21/17  9:04 PM  Result Value Ref Range   Lipase 1,237 (H) 11 - 51 U/L    Comment: RESULTS CONFIRMED BY MANUAL DILUTION  Magnesium     Status: None   Collection Time: 10/21/17  9:04 PM  Result Value Ref Range   Magnesium 2.0 1.7 - 2.4 mg/dL  Urinalysis, Routine w reflex microscopic     Status: Abnormal   Collection Time: 10/21/17  9:10 PM  Result Value Ref Range   Color, Urine AMBER (A) YELLOW    Comment: BIOCHEMICALS MAY BE AFFECTED BY COLOR   APPearance CLEAR CLEAR   Specific Gravity, Urine 1.023 1.005 - 1.030   pH 5.0 5.0 - 8.0   Glucose, UA NEGATIVE NEGATIVE mg/dL   Hgb urine dipstick SMALL (A) NEGATIVE   Bilirubin Urine MODERATE (A) NEGATIVE   Ketones, ur NEGATIVE NEGATIVE mg/dL   Protein, ur 30 (A) NEGATIVE mg/dL   Nitrite NEGATIVE NEGATIVE   Leukocytes, UA NEGATIVE NEGATIVE   RBC / HPF 0-5 0 - 5 RBC/hpf   WBC, UA 0-5 0 - 5 WBC/hpf   Bacteria, UA NONE SEEN NONE SEEN   Squamous Epithelial / LPF 0-5 (A) NONE SEEN   Mucus PRESENT    Hyaline Casts, UA PRESENT    Granular Casts, UA PRESENT   I-Stat CG4 Lactic Acid, ED     Status: None   Collection Time: 10/22/17 12:39 AM  Result Value Ref Range   Lactic Acid, Venous 0.99 0.5 - 1.9 mmol/L  CBC WITH DIFFERENTIAL     Status: Abnormal   Collection Time: 10/22/17  7:28 AM  Result Value Ref Range   WBC 10.9 (H) 4.0 - 10.5 K/uL   RBC 4.75 4.22 - 5.81 MIL/uL    Hemoglobin 14.7 13.0 - 17.0 g/dL   HCT 45.0 39.0 - 52.0 %   MCV 94.7 78.0 - 100.0 fL   MCH 30.9 26.0 - 34.0 pg   MCHC 32.7 30.0 - 36.0 g/dL  RDW 14.3 11.5 - 15.5 %   Platelets 215 150 - 400 K/uL   Neutrophils Relative % 87 %   Neutro Abs 9.4 (H) 1.7 - 7.7 K/uL   Lymphocytes Relative 7 %   Lymphs Abs 0.8 0.7 - 4.0 K/uL   Monocytes Relative 6 %   Monocytes Absolute 0.6 0.1 - 1.0 K/uL   Eosinophils Relative 0 %   Eosinophils Absolute 0.0 0.0 - 0.7 K/uL   Basophils Relative 0 %   Basophils Absolute 0.0 0.0 - 0.1 K/uL  Comprehensive metabolic panel     Status: Abnormal   Collection Time: 10/22/17  7:28 AM  Result Value Ref Range   Sodium 138 135 - 145 mmol/L   Potassium 4.1 3.5 - 5.1 mmol/L    Comment: DELTA CHECK NOTED REPEAT/RECALIBRATE RESULT REPEATED AND VERIFIED    Chloride 107 101 - 111 mmol/L   CO2 22 22 - 32 mmol/L   Glucose, Bld 138 (H) 65 - 99 mg/dL   BUN 11 6 - 20 mg/dL   Creatinine, Ser 0.87 0.61 - 1.24 mg/dL   Calcium 8.3 (L) 8.9 - 10.3 mg/dL   Total Protein 6.3 (L) 6.5 - 8.1 g/dL   Albumin 3.4 (L) 3.5 - 5.0 g/dL   AST 80 (H) 15 - 41 U/L   ALT 153 (H) 17 - 63 U/L   Alkaline Phosphatase 262 (H) 38 - 126 U/L   Total Bilirubin 4.7 (H) 0.3 - 1.2 mg/dL    Comment: DELTA CHECK NOTED   GFR calc non Af Amer >60 >60 mL/min   GFR calc Af Amer >60 >60 mL/min    Comment: (NOTE) The eGFR has been calculated using the CKD EPI equation. This calculation has not been validated in all clinical situations. eGFR's persistently <60 mL/min signify possible Chronic Kidney Disease.    Anion gap 9 5 - 15  Lipase, blood     Status: Abnormal   Collection Time: 10/22/17  7:28 AM  Result Value Ref Range   Lipase 452 (H) 11 - 51 U/L    Comment: RESULTS CONFIRMED BY MANUAL DILUTION  Bilirubin, direct     Status: Abnormal   Collection Time: 10/22/17  7:28 AM  Result Value Ref Range   Bilirubin, Direct 2.5 (H) 0.1 - 0.5 mg/dL  Lipid panel     Status: Abnormal   Collection Time:  10/22/17  7:43 AM  Result Value Ref Range   Cholesterol 208 (H) 0 - 200 mg/dL   Triglycerides 132 <150 mg/dL   HDL 36 (L) >40 mg/dL   Total CHOL/HDL Ratio 5.8 RATIO   VLDL 26 0 - 40 mg/dL   LDL Cholesterol 146 (H) 0 - 99 mg/dL    Comment:        Total Cholesterol/HDL:CHD Risk Coronary Heart Disease Risk Table                     Men   Women  1/2 Average Risk   3.4   3.3  Average Risk       5.0   4.4  2 X Average Risk   9.6   7.1  3 X Average Risk  23.4   11.0        Use the calculated Patient Ratio above and the CHD Risk Table to determine the patient's CHD Risk.        ATP III CLASSIFICATION (LDL):  <100     mg/dL   Optimal  100-129  mg/dL  Near or Above                    Optimal  130-159  mg/dL   Borderline  160-189  mg/dL   High  >190     mg/dL   Very High     Ct Abdomen Pelvis W Contrast  Result Date: 10/22/2017 CLINICAL DATA:  Generalized abdominal pain with nausea and vomiting EXAM: CT ABDOMEN AND PELVIS WITH CONTRAST TECHNIQUE: Multidetector CT imaging of the abdomen and pelvis was performed using the standard protocol following bolus administration of intravenous contrast. CONTRAST:  148m ISOVUE-300 IOPAMIDOL (ISOVUE-300) INJECTION 61% COMPARISON:  None. FINDINGS: Lower chest: Lung bases demonstrate patchy dependent atelectasis. Normal heart size. Small hiatal hernia with thickening. Hepatobiliary: Mild intra hepatic biliary dilatation. Multiple calcified gallstones. Possible mild gallbladder wall thickening. Enlarged extrahepatic bile duct, measuring up to 11 mm at the head of the pancreas. Possible punctate stones near the duct insertion, series 2, image number 37. Pancreas: Enlarged appearing pancreas. Moderate-to-marked fluid, soft tissue stranding and edema cephalad to the pancreas, suspect for pancreatitis. No focal pancreatic hypoenhancement. Slightly more focal fluid collection adjacent to the fundus of the stomach but without rim enhancement. Spleen: Normal in  size without focal abnormality. Adrenals/Urinary Tract: Adrenal glands are unremarkable. Kidneys are normal, without renal calculi, focal lesion, or hydronephrosis. Bladder is unremarkable. Stomach/Bowel: Stomach is nonenlarged. No dilated small bowel. Sigmoid colon diverticula, no definitive inflammation. Normal appendix Vascular/Lymphatic: Aortic atherosclerosis. No aneurysmal dilatation. Normal enhancement of splenic and portal vessels at this time. Reproductive: Prostate is unremarkable. Other: Negative for free air. Small amount of free fluid in the pelvis. Fluid extending inferiorly within the left anterior pararenal space, and anterior to the psoas muscle. Musculoskeletal: Degenerative changes. No acute or suspicious lesion IMPRESSION: 1. Moderate fluid, edema and inflammation within the upper abdomen, felt most consistent with acute pancreatitis. Slightly more focal fluid is seen adjacent to the fundus of the stomach but without rim enhancement at this time. 2. Cholelithiasis. Mild intra hepatic and moderate-to-marked extrahepatic biliary dilatation. Suspect punctate stones within the distal duct. Recommend correlation with LFTs. 3. Small amount of free fluid in the pelvis. 4. Sigmoid colon diverticula without acute inflammation Electronically Signed   By: KDonavan FoilM.D.   On: 10/22/2017 01:25   UKoreaAbdomen Limited Ruq  Result Date: 10/22/2017 CLINICAL DATA:  Epigastric pain EXAM: ULTRASOUND ABDOMEN LIMITED RIGHT UPPER QUADRANT COMPARISON:  CT earlier today FINDINGS: Gallbladder: Multiple gallstones are present. The largest gallstone is 0.8 cm in diameter. No wall thickening. No pericholecystic fluid. There is sludge within the gallbladder. Tiny hyperechoic structures in the gallbladder lumen likely represents additional tiny cholesterol stones. Negative Murphy sign. Common bile duct: Diameter: The common bile duct is dilated at 11 mm. Liver: The liver is diffusely heterogeneous without focal mass.  Intrahepatic biliary dilatation is suspected. Portal vein is patent on color Doppler imaging with normal direction of blood flow towards the liver. Additional finding: There is a small amount of free fluid between the right kidney and liver in the Morison's pouch. IMPRESSION: Cholelithiasis. Intra and extrahepatic biliary dilatation suggest distal biliary obstruction. MRCP or ERCP can be performed to further delineate. Heterogeneous liver. Diffuse hepatic parenchymal disease cannot be excluded. Correlation with liver function tests is recommended. Trace free-fluid. Electronically Signed   By: AMarybelle KillingsM.D.   On: 10/22/2017 09:39    ROS Blood pressure 124/88, pulse (!) 128, temperature 98.3 F (36.8 C), temperature source Oral, resp. rate 18,  height 5' 9"  (1.753 m), weight 160 lb (72.6 kg), SpO2 96 %. Physical Exam Alert and oriented. Skin warm and dry. Oral mucosa is moist.   . Sclera aicteric, conjunctivae is pink. Thyroid not enlarged. No cervical lymphadenopathy. Lungs clear. Heart regular rate and rhythm.  Abdomen is soft. Bowel sounds are . No hepatomegaly. No abdominal masses, abdomen is full, but soft. I did not hear any bowel sounds. Diffuse abdominal tenderness.    No edema to lower extremities.    Assessment/Plan: Biliay Pancreatitis. Dr. Laural Golden is aware Probable CBD stone. Marland Kitchen  Edie Darley W 10/22/2017, 4:17 PM

## 2017-10-23 ENCOUNTER — Inpatient Hospital Stay (HOSPITAL_COMMUNITY): Payer: Medicare Other

## 2017-10-23 LAB — CBC WITH DIFFERENTIAL/PLATELET
BASOS ABS: 0 10*3/uL (ref 0.0–0.1)
Basophils Relative: 0 %
EOS PCT: 0 %
Eosinophils Absolute: 0 10*3/uL (ref 0.0–0.7)
HCT: 48.6 % (ref 39.0–52.0)
Hemoglobin: 15.6 g/dL (ref 13.0–17.0)
LYMPHS PCT: 11 %
Lymphs Abs: 1.3 10*3/uL (ref 0.7–4.0)
MCH: 31 pg (ref 26.0–34.0)
MCHC: 32.1 g/dL (ref 30.0–36.0)
MCV: 96.6 fL (ref 78.0–100.0)
MONO ABS: 0.8 10*3/uL (ref 0.1–1.0)
MONOS PCT: 7 %
Neutro Abs: 9.8 10*3/uL — ABNORMAL HIGH (ref 1.7–7.7)
Neutrophils Relative %: 82 %
PLATELETS: 234 10*3/uL (ref 150–400)
RBC: 5.03 MIL/uL (ref 4.22–5.81)
RDW: 15 % (ref 11.5–15.5)
WBC: 11.9 10*3/uL — ABNORMAL HIGH (ref 4.0–10.5)

## 2017-10-23 LAB — COMPREHENSIVE METABOLIC PANEL
ALT: 91 U/L — ABNORMAL HIGH (ref 17–63)
ANION GAP: 9 (ref 5–15)
AST: 31 U/L (ref 15–41)
Albumin: 2.8 g/dL — ABNORMAL LOW (ref 3.5–5.0)
Alkaline Phosphatase: 199 U/L — ABNORMAL HIGH (ref 38–126)
BUN: 16 mg/dL (ref 6–20)
CHLORIDE: 107 mmol/L (ref 101–111)
CO2: 22 mmol/L (ref 22–32)
Calcium: 8.1 mg/dL — ABNORMAL LOW (ref 8.9–10.3)
Creatinine, Ser: 0.88 mg/dL (ref 0.61–1.24)
Glucose, Bld: 147 mg/dL — ABNORMAL HIGH (ref 65–99)
POTASSIUM: 4.3 mmol/L (ref 3.5–5.1)
Sodium: 138 mmol/L (ref 135–145)
TOTAL PROTEIN: 5.8 g/dL — AB (ref 6.5–8.1)
Total Bilirubin: 6.4 mg/dL — ABNORMAL HIGH (ref 0.3–1.2)

## 2017-10-23 LAB — LIPASE, BLOOD: LIPASE: 171 U/L — AB (ref 11–51)

## 2017-10-23 MED ORDER — SODIUM CHLORIDE 0.9 % IV SOLN
INTRAVENOUS | Status: AC
  Administered 2017-10-23: 18:00:00 via INTRAVENOUS

## 2017-10-23 MED ORDER — PANTOPRAZOLE SODIUM 40 MG IV SOLR
40.0000 mg | Freq: Two times a day (BID) | INTRAVENOUS | Status: DC
  Administered 2017-10-23 – 2017-10-27 (×8): 40 mg via INTRAVENOUS
  Filled 2017-10-23 (×8): qty 40

## 2017-10-23 MED ORDER — GADOBENATE DIMEGLUMINE 529 MG/ML IV SOLN
15.0000 mL | Freq: Once | INTRAVENOUS | Status: AC | PRN
  Administered 2017-10-23: 15 mL via INTRAVENOUS

## 2017-10-23 NOTE — Progress Notes (Signed)
**Note De-identified Rick Velazquez Obfuscation** EKG complete and placed in patient chart 

## 2017-10-23 NOTE — Progress Notes (Signed)
PROGRESS NOTE    Rick Velazquez  UJW:[id number] DOB: Mar 27, 1971 DOA: 10/21/2017 PCP: System, Pcp Not In     Brief Narrative:  46 year old man admitted from home on 11/27 after presenting to the ED with complaints of abdominal pain.  Has a history significant for tobacco abuse and a hiatal hernia who complains of several days of epigastric abdominal pain associated with nausea vomiting and diarrhea.  He was found to be jaundiced with elevated LFTs and admission was requested.   Assessment & Plan:   Principal Problem:   Acute gallstone pancreatitis Active Problems:   Hypokalemia   Tobacco use   Choledocholithiasis   Acute gallstone pancreatitis -On imaging today pancreatitis looks worse,we will maintain n.p.o. Status. -Continue aggressive IV fluid repletion. -Evidence of pseudocysts on MRI, GI on board to assist with further management.  Transaminitis -LFTs are improving, suspect he spontaneously passed a stone that led to pancreatitis. -MRCP without evidence for choledocholithiasis, bili is up today however. -GI following. -C9 for antibiotics, will discontinue.  Hypokalemia -Likely due to GI losses with emesis. -Has been adequately replaced.   DVT prophylaxis: SCDs Code Status: Full code Family Communication: Patient only Disposition Plan: Pending improvement in medical stability  Consultants:   GI  Procedures:   MRCP as below  Antimicrobials:  Anti-infectives (From admission, onward)   Start     Dose/Rate Route Frequency Ordered Stop   10/22/17 1730  cefTRIAXone (ROCEPHIN) 1 g in dextrose 5 % 50 mL IVPB     1 g 100 mL/hr over 30 Minutes Intravenous Every 24 hours 10/22/17 1719         Subjective: Feels like he wants to eat, still with significant epigastric abdominal pain  Objective: Vitals:   10/22/17 1515 10/22/17 2231 10/23/17 0535 10/23/17 1503  BP: 124/88 112/83 121/82 118/84  Pulse: (!) 128 (!) 130 (!) 132 (!) 124  Resp: 18 20 20 20   Temp:  98.3 F (36.8 C) 98.1 F (36.7 C) 98.5 F (36.9 C) 98.4 F (36.9 C)  TempSrc: Oral Oral Oral Oral  SpO2: 96% 94% 93% 94%  Weight:      Height:        Intake/Output Summary (Last 24 hours) at 10/23/2017 1806 Last data filed at 10/23/2017 1546 Gross per 24 hour  Intake 2770.83 ml  Output 475 ml  Net 2295.83 ml   Filed Weights   10/21/17 2050  Weight: 72.6 kg (160 lb)    Examination:  General exam: Alert, awake, oriented x 3, jaundiced with yellow tint to sclera and skin Respiratory system: Clear to auscultation. Respiratory effort normal. Cardiovascular system:RRR. No murmurs, rubs, gallops. Gastrointestinal system: Abdomen is nondistended, tender to palpation of the epigastric area as well as the right upper quadrant. No organomegaly or masses felt. Normal bowel sounds heard. Central nervous system: Alert and oriented. No focal neurological deficits. Extremities: No C/C/E, +pedal pulses Skin: No rashes, lesions or ulcers Psychiatry: Judgement and insight appear normal. Mood & affect appropriate.     Data Reviewed: I have personally reviewed following labs and imaging studies  CBC: Recent Labs  Lab 10/21/17 2104 10/22/17 0728 10/23/17 0500  WBC 10.6* 10.9* 11.9*  NEUTROABS 8.1* 9.4* 9.8*  HGB 15.0 14.7 15.6  HCT 45.1 45.0 48.6  MCV 93.4 94.7 96.6  PLT 252 215 234   Basic Metabolic Panel: Recent Labs  Lab 10/21/17 2104 10/22/17 0728 10/23/17 0500  NA 135 138 138  K 3.2* 4.1 4.3  CL 97* 107 107  CO2  26 22 22   GLUCOSE 185* 138* 147*  BUN 12 11 16   CREATININE 0.78 0.87 0.88  CALCIUM 10.0 8.3* 8.1*  MG 2.0  --   --    GFR: Estimated Creatinine Clearance: 104.9 mL/min (by C-G formula based on SCr of 0.88 mg/dL). Liver Function Tests: Recent Labs  Lab 10/21/17 2104 10/22/17 0728 10/23/17 0500  AST 131* 80* 31  ALT 202* 153* 91*  ALKPHOS 337* 262* 199*  BILITOT 10.1* 4.7* 6.4*  PROT 7.6 6.3* 5.8*  ALBUMIN 4.0 3.4* 2.8*   Recent Labs  Lab  10/21/17 2104 10/22/17 0728 10/23/17 0500  LIPASE 1,237* 452* 171*   No results for input(s): AMMONIA in the last 168 hours. Coagulation Profile: No results for input(s): INR, PROTIME in the last 168 hours. Cardiac Enzymes: No results for input(s): CKTOTAL, CKMB, CKMBINDEX, TROPONINI in the last 168 hours. BNP (last 3 results) No results for input(s): PROBNP in the last 8760 hours. HbA1C: No results for input(s): HGBA1C in the last 72 hours. CBG: No results for input(s): GLUCAP in the last 168 hours. Lipid Profile: Recent Labs    10/22/17 0743  CHOL 208*  HDL 36*  LDLCALC 146*  TRIG 132  CHOLHDL 5.8   Thyroid Function Tests: No results for input(s): TSH, T4TOTAL, FREET4, T3FREE, THYROIDAB in the last 72 hours. Anemia Panel: No results for input(s): VITAMINB12, FOLATE, FERRITIN, TIBC, IRON, RETICCTPCT in the last 72 hours. Urine analysis:    Component Value Date/Time   COLORURINE AMBER (A) 10/21/2017 2110   APPEARANCEUR CLEAR 10/21/2017 2110   LABSPEC 1.023 10/21/2017 2110   PHURINE 5.0 10/21/2017 2110   GLUCOSEU NEGATIVE 10/21/2017 2110   HGBUR SMALL (A) 10/21/2017 2110   BILIRUBINUR MODERATE (A) 10/21/2017 2110   KETONESUR NEGATIVE 10/21/2017 2110   PROTEINUR 30 (A) 10/21/2017 2110   NITRITE NEGATIVE 10/21/2017 2110   LEUKOCYTESUR NEGATIVE 10/21/2017 2110   Sepsis Labs: @LABRCNTIP (procalcitonin:4,lacticidven:4)  )No results found for this or any previous visit (from the past 240 hour(s)).       Radiology Studies: Ct Abdomen Pelvis W Contrast  Result Date: 10/22/2017 CLINICAL DATA:  Generalized abdominal pain with nausea and vomiting EXAM: CT ABDOMEN AND PELVIS WITH CONTRAST TECHNIQUE: Multidetector CT imaging of the abdomen and pelvis was performed using the standard protocol following bolus administration of intravenous contrast. CONTRAST:  ISOVUE-300 IOPAMIDOL (ISOVUE-300) INJECTION 61% COMPARISON:  None. FINDINGS: Lower chest: Lung bases  demonstrate patchy dependent atelectasis. Normal heart size. Small hiatal hernia with thickening. Hepatobiliary: Mild intra hepatic biliary dilatation. Multiple calcified gallstones. Possible mild gallbladder wall thickening. Enlarged extrahepatic bile duct, measuring up to 11 mm at the head of the pancreas. Possible punctate stones near the duct insertion, series 2, image number 37. Pancreas: Enlarged appearing pancreas. Moderate-to-marked fluid, soft tissue stranding and edema cephalad to the pancreas, suspect for pancreatitis. No focal pancreatic hypoenhancement. Slightly more focal fluid collection adjacent to the fundus of the stomach but without rim enhancement. Spleen: Normal in size without focal abnormality. Adrenals/Urinary Tract: Adrenal glands are unremarkable. Kidneys are normal, without renal calculi, focal lesion, or hydronephrosis. Bladder is unremarkable. Stomach/Bowel: Stomach is nonenlarged. No dilated small bowel. Sigmoid colon diverticula, no definitive inflammation. Normal appendix Vascular/Lymphatic: Aortic atherosclerosis. No aneurysmal dilatation. Normal enhancement of splenic and portal vessels at this time. Reproductive: Prostate is unremarkable. Other: Negative for free air. Small amount of free fluid in the pelvis. Fluid extending inferiorly within the left anterior pararenal space, and anterior to the psoas muscle. Musculoskeletal: Degenerative changes.  No acute or suspicious lesion IMPRESSION: 1. Moderate fluid, edema and inflammation within the upper abdomen, felt most consistent with acute pancreatitis. Slightly more focal fluid is seen adjacent to the fundus of the stomach but without rim enhancement at this time. 2. Cholelithiasis. Mild intra hepatic and moderate-to-marked extrahepatic biliary dilatation. Suspect punctate stones within the distal duct. Recommend correlation with LFTs. 3. Small amount of free fluid in the pelvis. 4. Sigmoid colon diverticula without acute  inflammation Electronically Signed   By: Jasmine Pang M.D.   On: 10/22/2017 01:25   Mr 3d Recon At Scanner  Result Date: 10/23/2017 CLINICAL DATA:  46 year old male with history of upper abdominal pain and swelling. Pancreatitis noted on recent CT the abdomen and pelvis there biliary dilatation and findings concerning for potential choledocholithiasis. Followup study. EXAM: MRI ABDOMEN WITHOUT AND WITH CONTRAST (INCLUDING MRCP) TECHNIQUE: Multiplanar multisequence MR imaging of the abdomen was performed both before and after the administration of intravenous contrast. Heavily T2-weighted images of the biliary and pancreatic ducts were obtained, and three-dimensional MRCP images were rendered by post processing. CONTRAST:  15mL MULTIHANCE GADOBENATE DIMEGLUMINE 529 MG/ML IV SOLN COMPARISON:  No prior abdominal MRI. CT the abdomen and pelvis and ultrasound of the abdomen 10/14/2017. FINDINGS: Comment: Portions of today's examination are limited by artifact related to considerable patient motion. Lower chest: Small bilateral pleural effusions lying dependently. Increased signal intensity in the adjacent portions of the lungs likely to represent areas of passive atelectasis. Hepatobiliary: No cystic or solid hepatic lesions. MRCP images are poor quality secondary to patient motion. With this limitation in mind, there is no significant intrahepatic biliary ductal dilatation. Common bile duct appears mildly dilated measuring 9 mm in the porta hepatis. On today's study there is no definite filling defect in the distal common bile duct to to indicate the presence of choledocholithiasis. There are, however, many small filling defects within the lumen of the gallbladder, compatible with cholelithiasis. Gallbladder is nearly completely decompressed. Gallbladder does not appear thickened. Pancreas: No pancreatic mass. No pancreatic ductal dilatation. Pancreatic parenchyma appears to enhance normally. Extensive  inflammatory changes of are again noted adjacent to the distal body and tail of the pancreas, with increasing peripancreatic and retroperitoneal fluid. There is one well-defined fluid collection which extends cephalad from the tail of the pancreas coming in close contact with the undersurface of the proximal to mid stomach, most compatible with a developing pancreatic pseudocyst. This currently measures 6.2 x 7.6 x 5.2 cm (axial image 39 of series 18 and coronal image 58 of series 22). Spleen:  Unremarkable. Adrenals/Urinary Tract: Bilateral kidneys and bilateral adrenal glands are normal in appearance. No hydroureteronephrosis in the visualized portions of the abdomen. Stomach/Bowel: Enlarging pancreatic pseudocyst intimately associated with the undersurface of the proximal to mid stomach, as discussed above. Visualized portions are otherwise unremarkable. Vascular/Lymphatic: No aneurysm identified in the visualized abdominal vasculature. Splenic vein remains patent at this time. No lymphadenopathy noted in the abdomen. Other: Small volume of ascites. Increasing retroperitoneal fluid, predominantly T1 hypointense and T2 hyperintense. The exception to this is some T1 hyperintense fluid tracking along the left retroperitoneum immediately inferior to the tail of the pancreas (axial image 71 of series 15), which could indicate some proteinaceous and/or hemorrhagic contents. Musculoskeletal: No aggressive appearing osseous lesions are noted in the visualized portions of the skeleton. IMPRESSION: 1. Worsening acute pancreatitis, with enlarging pancreatic pseudocyst which is intimately associated with the undersurface of the proximal to mid stomach, an increasing inflammatory changes and fluid  throughout the retroperitoneum, some of which appears proteinaceous and/or hemorrhagic, as discussed above. 2. No definite signs of pancreatic necrosis noted at this time. 3. Cholelithiasis. No definite evidence to suggest an acute  cholecystitis or choledocholithiasis. Common bile duct is very mildly dilated measuring up to 9 mm in the porta hepatis. No intrahepatic biliary ductal dilatation. 4. Splenic vein remains patent. 5. Small volume of ascites. 6. Small bilateral pleural effusions with areas of passive atelectasis in the lower lobes of lungs bilaterally. Electronically Signed   By: Trudie Reedaniel  Entrikin M.D.   On: 10/23/2017 11:36   Mr Abdomen Mrcp Vivien RossettiW Wo Contast  Result Date: 10/23/2017 CLINICAL DATA:  46 year old male with history of upper abdominal pain and swelling. Pancreatitis noted on recent CT the abdomen and pelvis there biliary dilatation and findings concerning for potential choledocholithiasis. Followup study. EXAM: MRI ABDOMEN WITHOUT AND WITH CONTRAST (INCLUDING MRCP) TECHNIQUE: Multiplanar multisequence MR imaging of the abdomen was performed both before and after the administration of intravenous contrast. Heavily T2-weighted images of the biliary and pancreatic ducts were obtained, and three-dimensional MRCP images were rendered by post processing. CONTRAST:  15mL MULTIHANCE GADOBENATE DIMEGLUMINE 529 MG/ML IV SOLN COMPARISON:  No prior abdominal MRI. CT the abdomen and pelvis and ultrasound of the abdomen 10/14/2017. FINDINGS: Comment: Portions of today's examination are limited by artifact related to considerable patient motion. Lower chest: Small bilateral pleural effusions lying dependently. Increased signal intensity in the adjacent portions of the lungs likely to represent areas of passive atelectasis. Hepatobiliary: No cystic or solid hepatic lesions. MRCP images are poor quality secondary to patient motion. With this limitation in mind, there is no significant intrahepatic biliary ductal dilatation. Common bile duct appears mildly dilated measuring 9 mm in the porta hepatis. On today's study there is no definite filling defect in the distal common bile duct to to indicate the presence of choledocholithiasis. There  are, however, many small filling defects within the lumen of the gallbladder, compatible with cholelithiasis. Gallbladder is nearly completely decompressed. Gallbladder does not appear thickened. Pancreas: No pancreatic mass. No pancreatic ductal dilatation. Pancreatic parenchyma appears to enhance normally. Extensive inflammatory changes of are again noted adjacent to the distal body and tail of the pancreas, with increasing peripancreatic and retroperitoneal fluid. There is one well-defined fluid collection which extends cephalad from the tail of the pancreas coming in close contact with the undersurface of the proximal to mid stomach, most compatible with a developing pancreatic pseudocyst. This currently measures 6.2 x 7.6 x 5.2 cm (axial image 39 of series 18 and coronal image 58 of series 22). Spleen:  Unremarkable. Adrenals/Urinary Tract: Bilateral kidneys and bilateral adrenal glands are normal in appearance. No hydroureteronephrosis in the visualized portions of the abdomen. Stomach/Bowel: Enlarging pancreatic pseudocyst intimately associated with the undersurface of the proximal to mid stomach, as discussed above. Visualized portions are otherwise unremarkable. Vascular/Lymphatic: No aneurysm identified in the visualized abdominal vasculature. Splenic vein remains patent at this time. No lymphadenopathy noted in the abdomen. Other: Small volume of ascites. Increasing retroperitoneal fluid, predominantly T1 hypointense and T2 hyperintense. The exception to this is some T1 hyperintense fluid tracking along the left retroperitoneum immediately inferior to the tail of the pancreas (axial image 71 of series 15), which could indicate some proteinaceous and/or hemorrhagic contents. Musculoskeletal: No aggressive appearing osseous lesions are noted in the visualized portions of the skeleton. IMPRESSION: 1. Worsening acute pancreatitis, with enlarging pancreatic pseudocyst which is intimately associated with the  undersurface of the proximal to mid  stomach, an increasing inflammatory changes and fluid throughout the retroperitoneum, some of which appears proteinaceous and/or hemorrhagic, as discussed above. 2. No definite signs of pancreatic necrosis noted at this time. 3. Cholelithiasis. No definite evidence to suggest an acute cholecystitis or choledocholithiasis. Common bile duct is very mildly dilated measuring up to 9 mm in the porta hepatis. No intrahepatic biliary ductal dilatation. 4. Splenic vein remains patent. 5. Small volume of ascites. 6. Small bilateral pleural effusions with areas of passive atelectasis in the lower lobes of lungs bilaterally. Electronically Signed   By: Trudie Reedaniel  Entrikin M.D.   On: 10/23/2017 11:36   [place] Abdomen Limited Ruq  Result Date: 10/22/2017 CLINICAL DATA:  Epigastric pain EXAM: ULTRASOUND ABDOMEN LIMITED RIGHT UPPER QUADRANT COMPARISON:  CT earlier today FINDINGS: Gallbladder: Multiple gallstones are present. The largest gallstone is 0.8 cm in diameter. No wall thickening. No pericholecystic fluid. There is sludge within the gallbladder. Tiny hyperechoic structures in the gallbladder lumen likely represents additional tiny cholesterol stones. Negative Murphy sign. Common bile duct: Diameter: The common bile duct is dilated at 11 mm. Liver: The liver is diffusely heterogeneous without focal mass. Intrahepatic biliary dilatation is suspected. Portal vein is patent on color Doppler imaging with normal direction of blood flow towards the liver. Additional finding: There is a small amount of free fluid between the right kidney and liver in the Morison's pouch. IMPRESSION: Cholelithiasis. Intra and extrahepatic biliary dilatation suggest distal biliary obstruction. MRCP or ERCP can be performed to further delineate. Heterogeneous liver. Diffuse hepatic parenchymal disease cannot be excluded. Correlation with liver function tests is recommended. Trace free-fluid. Electronically Signed    By: Jolaine ClickArthur  Hoss M.D.   On: 10/22/2017 09:39        Scheduled Meds: . pantoprazole (PROTONIX) IV  40 mg Intravenous Q12H   Continuous Infusions: . sodium chloride 250 mL/hr at 10/23/17 1751  . 0.9 % NaCl with KCl 20 mEq / L 125 mL/hr at 10/23/17 1229  . cefTRIAXone (ROCEPHIN)  IV Stopped (10/23/17 1819)     LOS: 1 day    Time spent: 25 minutes. Greater than 50% of this time was spent in direct contact with the patient coordinating care.     Chaya JanEstela Hernandez Acosta, MD [facility] Pager [phone]  If 7PM-7AM, please contact night-coverage [email or web address] Password [email or web address] 10/23/2017, 6:06 PM

## 2017-10-23 NOTE — Progress Notes (Signed)
Patient reports feeling better today. He is having less pain. MRCP findings reviewed with patient. Multiple small stones in gallbladder but no evidence of choledocholithiasis and dilated bile duct. Extensive inflammatory changes noted around body and tail of pancreas with increase in peripancreatic and retroperitoneal fluid and 1 well-defined fluid collection between tail of pancreas and stomach.  It measures 62 x 76 x 52 mm. She is noted to be tachycardic.  Denies shortness of breath or chest pain.  Will obtain 12-lead EKG and place him on telemetry. Normal saline bolus at 250 mL/h for 2 hours. Monitor urine output. Cussed with Dr. Ardyth HarpsHernandez.

## 2017-10-23 NOTE — Progress Notes (Signed)
Patient ID: Rick Velazquez, male   DOB: 06/26/1971, 46 y.o.   MRN: [id number] States he feels 50% better. Had a BM last night. Blood pressure 121/82, pulse (!) 132, temperature 98.5 F (36.9 C), temperature source Oral, resp. rate 20, height 5\' 9"  (1.753 m), weight 160 lb (72.6 kg), SpO2 93 %. Abdomen is full. BS present today. There is tenderness.  Assessment: Biliary pancreatitis. Bilirubin up this am. MRCP today.

## 2017-10-23 NOTE — Care Management Note (Signed)
Case Management Note  Patient Details  Name: Rick Velazquez E Robicheaux MRN: [id number] Date of Birth: 05/14/1971  Subjective/Objective:       Admitted with pancreatitis. Chart reviewed for CM needs. Pt is from home, lives with family. He has PCP, transportation and insurance with drug coverage. No HH or DME needs noted pta.   MRCP today.            Action/Plan: Anticipate DC home with self care. No CM needs noted at this time. May be consulted if needs arise.   Expected Discharge Date:      10/24/2017            Expected Discharge Plan:  Home/Self Care  In-House Referral:  NA  Discharge planning Services  CM Consult  Post Acute Care Choice:  NA Choice offered to:  NA  Status of Service:  Completed, signed off  Malcolm MetroChildress, Jahmez Bily Demske, RN 10/23/2017, 12:27 PM

## 2017-10-24 DIAGNOSIS — K8021 Calculus of gallbladder without cholecystitis with obstruction: Secondary | ICD-10-CM

## 2017-10-24 LAB — CBC WITH DIFFERENTIAL/PLATELET
BASOS PCT: 0 %
Basophils Absolute: 0 10*3/uL (ref 0.0–0.1)
EOS ABS: 0 10*3/uL (ref 0.0–0.7)
EOS PCT: 0 %
HCT: 40.6 % (ref 39.0–52.0)
Hemoglobin: 12.8 g/dL — ABNORMAL LOW (ref 13.0–17.0)
LYMPHS ABS: 1.2 10*3/uL (ref 0.7–4.0)
Lymphocytes Relative: 14 %
MCH: 30.4 pg (ref 26.0–34.0)
MCHC: 31.5 g/dL (ref 30.0–36.0)
MCV: 96.4 fL (ref 78.0–100.0)
MONOS PCT: 8 %
Monocytes Absolute: 0.6 10*3/uL (ref 0.1–1.0)
Neutro Abs: 6.2 10*3/uL (ref 1.7–7.7)
Neutrophils Relative %: 78 %
PLATELETS: 200 10*3/uL (ref 150–400)
RBC: 4.21 MIL/uL — ABNORMAL LOW (ref 4.22–5.81)
RDW: 15.2 % (ref 11.5–15.5)
WBC: 8 10*3/uL (ref 4.0–10.5)

## 2017-10-24 LAB — COMPREHENSIVE METABOLIC PANEL
ALK PHOS: 115 U/L (ref 38–126)
ALT: 46 U/L (ref 17–63)
ANION GAP: 8 (ref 5–15)
AST: 18 U/L (ref 15–41)
Albumin: 2.5 g/dL — ABNORMAL LOW (ref 3.5–5.0)
BUN: 17 mg/dL (ref 6–20)
CALCIUM: 7.9 mg/dL — AB (ref 8.9–10.3)
CHLORIDE: 106 mmol/L (ref 101–111)
CO2: 22 mmol/L (ref 22–32)
Creatinine, Ser: 0.81 mg/dL (ref 0.61–1.24)
GFR calc non Af Amer: >60 mL/min (ref >60)
Glucose, Bld: 129 mg/dL — ABNORMAL HIGH (ref 65–99)
POTASSIUM: 3.9 mmol/L (ref 3.5–5.1)
SODIUM: 136 mmol/L (ref 135–145)
Total Bilirubin: 3.1 mg/dL — ABNORMAL HIGH (ref 0.3–1.2)
Total Protein: 5.6 g/dL — ABNORMAL LOW (ref 6.5–8.1)

## 2017-10-24 LAB — LIPASE, BLOOD: Lipase: 53 U/L — ABNORMAL HIGH (ref 11–51)

## 2017-10-24 LAB — HIV ANTIBODY (ROUTINE TESTING W REFLEX): HIV Screen 4th Generation wRfx: NONREACTIVE

## 2017-10-24 MED ORDER — LORAZEPAM 1 MG PO TABS
1.0000 mg | ORAL_TABLET | Freq: Every evening | ORAL | Status: DC | PRN
  Administered 2017-10-24 – 2017-10-26 (×3): 1 mg via ORAL
  Filled 2017-10-24 (×3): qty 1

## 2017-10-24 NOTE — Progress Notes (Signed)
PROGRESS NOTE    Rick Velazquez  ZOX:[id number] DOB: 05/17/1971 DOA: 10/21/2017 PCP: System, Pcp Not In     Brief Narrative:  46 year old man admitted on 11/27 after presenting to the ED with complaints of abdominal pain.  He was found to be jaundiced with elevated LFTs and admission was requested.   Assessment & Plan:   Principal Problem:   Acute gallstone pancreatitis Active Problems:   Hypokalemia   Tobacco use   Choledocholithiasis   Acute gallstone pancreatitis -Patient is clinically improved, states his pain is at a 3-5 level. -We will advance diet to clear liquids. -GI is on board. -There is evidence of pseudocyst on MRI.  Transaminitis -LFTs and bilirubin are significantly improved today.  Suspect he spontaneously passed a stone that led to pancreatitis. -MRCP without evidence for choledocholithiasis. -We will need surgical evaluation for cholecystectomy as an outpatient once acute episode resolved.  Hypokalemia -Resolved   DVT prophylaxis: SCDs Code Status: Full code Family Communication: Patient only Disposition Plan: Anticipate discharge home in 48-72 hours pending medical stability  Consultants:   GI  Procedures:   None  Antimicrobials:  Anti-infectives (From admission, onward)   Start     Dose/Rate Route Frequency Ordered Stop   10/22/17 1730  cefTRIAXone (ROCEPHIN) 1 g in dextrose 5 % 50 mL IVPB  Status:  Discontinued     1 g 100 mL/hr over 30 Minutes Intravenous Every 24 hours 10/22/17 1719 10/23/17 1809       Subjective: Feels much improved, would like to eat  Objective: Vitals:   10/23/17 1754 10/23/17 2128 10/24/17 0540 10/24/17 1416  BP: (!) 132/24 137/85 137/86 135/81  Pulse: (!) 126 (!) 123 (!) 105 (!) 106  Resp:  20 20 20   Temp:  (!) 97.5 F (36.4 C) 98.2 F (36.8 C) 98.2 F (36.8 C)  TempSrc:  Oral Oral Oral  SpO2:  94% 94% 92%  Weight:      Height:        Intake/Output Summary (Last 24 hours) at 10/24/2017  1650 Last data filed at 10/24/2017 1550 Gross per 24 hour  Intake 3759.17 ml  Output 1975 ml  Net 1784.17 ml   Filed Weights   10/21/17 2050  Weight: 72.6 kg (160 lb)    Examination:  General exam: Alert, awake, oriented x 3 Respiratory system: Clear to auscultation. Respiratory effort normal. Cardiovascular system:RRR. No murmurs, rubs, gallops. Gastrointestinal system: Abdomen is nondistended, soft and nontender. No organomegaly or masses felt. Normal bowel sounds heard. Central nervous system: Alert and oriented. No focal neurological deficits. Extremities: No C/C/E, +pedal pulses Skin: No rashes, lesions or ulcers Psychiatry: Judgement and insight appear normal. Mood & affect appropriate.     Data Reviewed: I have personally reviewed following labs and imaging studies  CBC: Recent Labs  Lab 10/21/17 2104 10/22/17 0728 10/23/17 0500 10/24/17 0433  WBC 10.6* 10.9* 11.9* 8.0  NEUTROABS 8.1* 9.4* 9.8* 6.2  HGB 15.0 14.7 15.6 12.8*  HCT 45.1 45.0 48.6 40.6  MCV 93.4 94.7 96.6 96.4  PLT 252 215 234 200   Basic Metabolic Panel: Recent Labs  Lab 10/21/17 2104 10/22/17 0728 10/23/17 0500 10/24/17 0433  NA 135 138 138 136  K 3.2* 4.1 4.3 3.9  CL 97* 107 107 106  CO2 26 22 22 22   GLUCOSE 185* 138* 147* 129*  BUN 12 11 16 17   CREATININE 0.78 0.87 0.88 0.81  CALCIUM 10.0 8.3* 8.1* 7.9*  MG 2.0  --   --   --  GFR: Estimated Creatinine Clearance: 114 mL/min (by C-G formula based on SCr of 0.81 mg/dL). Liver Function Tests: Recent Labs  Lab 10/21/17 2104 10/22/17 0728 10/23/17 0500 10/24/17 0433  AST 131* 80* 31 18  ALT 202* 153* 91* 46  ALKPHOS 337* 262* 199* 115  BILITOT 10.1* 4.7* 6.4* 3.1*  PROT 7.6 6.3* 5.8* 5.6*  ALBUMIN 4.0 3.4* 2.8* 2.5*   Recent Labs  Lab 10/21/17 2104 10/22/17 0728 10/23/17 0500 10/24/17 0433  LIPASE 1,237* 452* 171* 53*   No results for input(s): AMMONIA in the last 168 hours. Coagulation Profile: No results for  input(s): INR, PROTIME in the last 168 hours. Cardiac Enzymes: No results for input(s): CKTOTAL, CKMB, CKMBINDEX, TROPONINI in the last 168 hours. BNP (last 3 results) No results for input(s): PROBNP in the last 8760 hours. HbA1C: No results for input(s): HGBA1C in the last 72 hours. CBG: No results for input(s): GLUCAP in the last 168 hours. Lipid Profile: Recent Labs    10/22/17 0743  CHOL 208*  HDL 36*  LDLCALC 146*  TRIG 132  CHOLHDL 5.8   Thyroid Function Tests: No results for input(s): TSH, T4TOTAL, FREET4, T3FREE, THYROIDAB in the last 72 hours. Anemia Panel: No results for input(s): VITAMINB12, FOLATE, FERRITIN, TIBC, IRON, RETICCTPCT in the last 72 hours. Urine analysis:    Component Value Date/Time   COLORURINE AMBER (A) 10/21/2017 2110   APPEARANCEUR CLEAR 10/21/2017 2110   LABSPEC 1.023 10/21/2017 2110   PHURINE 5.0 10/21/2017 2110   GLUCOSEU NEGATIVE 10/21/2017 2110   HGBUR SMALL (A) 10/21/2017 2110   BILIRUBINUR MODERATE (A) 10/21/2017 2110   KETONESUR NEGATIVE 10/21/2017 2110   PROTEINUR 30 (A) 10/21/2017 2110   NITRITE NEGATIVE 10/21/2017 2110   LEUKOCYTESUR NEGATIVE 10/21/2017 2110   Sepsis Labs: @LABRCNTIP (procalcitonin:4,lacticidven:4)  )No results found for this or any previous visit (from the past 240 hour(s)).       Radiology Studies: Mr 3d Recon At Scanner  Result Date: 10/23/2017 CLINICAL DATA:  47 year old male with history of upper abdominal pain and swelling. Pancreatitis noted on recent CT the abdomen and pelvis there biliary dilatation and findings concerning for potential choledocholithiasis. Followup study. EXAM: MRI ABDOMEN WITHOUT AND WITH CONTRAST (INCLUDING MRCP) TECHNIQUE: Multiplanar multisequence MR imaging of the abdomen was performed both before and after the administration of intravenous contrast. Heavily T2-weighted images of the biliary and pancreatic ducts were obtained, and three-dimensional MRCP images were rendered by  post processing. CONTRAST:  15mL MULTIHANCE GADOBENATE DIMEGLUMINE 529 MG/ML IV SOLN COMPARISON:  No prior abdominal MRI. CT the abdomen and pelvis and ultrasound of the abdomen 10/14/2017. FINDINGS: Comment: Portions of today's examination are limited by artifact related to considerable patient motion. Lower chest: Small bilateral pleural effusions lying dependently. Increased signal intensity in the adjacent portions of the lungs likely to represent areas of passive atelectasis. Hepatobiliary: No cystic or solid hepatic lesions. MRCP images are poor quality secondary to patient motion. With this limitation in mind, there is no significant intrahepatic biliary ductal dilatation. Common bile duct appears mildly dilated measuring 9 mm in the porta hepatis. On today's study there is no definite filling defect in the distal common bile duct to to indicate the presence of choledocholithiasis. There are, however, many small filling defects within the lumen of the gallbladder, compatible with cholelithiasis. Gallbladder is nearly completely decompressed. Gallbladder does not appear thickened. Pancreas: No pancreatic mass. No pancreatic ductal dilatation. Pancreatic parenchyma appears to enhance normally. Extensive inflammatory changes of are again noted adjacent to  the distal body and tail of the pancreas, with increasing peripancreatic and retroperitoneal fluid. There is one well-defined fluid collection which extends cephalad from the tail of the pancreas coming in close contact with the undersurface of the proximal to mid stomach, most compatible with a developing pancreatic pseudocyst. This currently measures 6.2 x 7.6 x 5.2 cm (axial image 39 of series 18 and coronal image 58 of series 22). Spleen:  Unremarkable. Adrenals/Urinary Tract: Bilateral kidneys and bilateral adrenal glands are normal in appearance. No hydroureteronephrosis in the visualized portions of the abdomen. Stomach/Bowel: Enlarging pancreatic  pseudocyst intimately associated with the undersurface of the proximal to mid stomach, as discussed above. Visualized portions are otherwise unremarkable. Vascular/Lymphatic: No aneurysm identified in the visualized abdominal vasculature. Splenic vein remains patent at this time. No lymphadenopathy noted in the abdomen. Other: Small volume of ascites. Increasing retroperitoneal fluid, predominantly T1 hypointense and T2 hyperintense. The exception to this is some T1 hyperintense fluid tracking along the left retroperitoneum immediately inferior to the tail of the pancreas (axial image 71 of series 15), which could indicate some proteinaceous and/or hemorrhagic contents. Musculoskeletal: No aggressive appearing osseous lesions are noted in the visualized portions of the skeleton. IMPRESSION: 1. Worsening acute pancreatitis, with enlarging pancreatic pseudocyst which is intimately associated with the undersurface of the proximal to mid stomach, an increasing inflammatory changes and fluid throughout the retroperitoneum, some of which appears proteinaceous and/or hemorrhagic, as discussed above. 2. No definite signs of pancreatic necrosis noted at this time. 3. Cholelithiasis. No definite evidence to suggest an acute cholecystitis or choledocholithiasis. Common bile duct is very mildly dilated measuring up to 9 mm in the porta hepatis. No intrahepatic biliary ductal dilatation. 4. Splenic vein remains patent. 5. Small volume of ascites. 6. Small bilateral pleural effusions with areas of passive atelectasis in the lower lobes of lungs bilaterally. Electronically Signed   By: Trudie Reed M.D.   On: 10/23/2017 11:36   Mr Abdomen Mrcp Vivien Rossetti Contast  Result Date: 10/23/2017 CLINICAL DATA:  46 year old male with history of upper abdominal pain and swelling. Pancreatitis noted on recent CT the abdomen and pelvis there biliary dilatation and findings concerning for potential choledocholithiasis. Followup study. EXAM:  MRI ABDOMEN WITHOUT AND WITH CONTRAST (INCLUDING MRCP) TECHNIQUE: Multiplanar multisequence MR imaging of the abdomen was performed both before and after the administration of intravenous contrast. Heavily T2-weighted images of the biliary and pancreatic ducts were obtained, and three-dimensional MRCP images were rendered by post processing. CONTRAST:  15mL MULTIHANCE GADOBENATE DIMEGLUMINE 529 MG/ML IV SOLN COMPARISON:  No prior abdominal MRI. CT the abdomen and pelvis and ultrasound of the abdomen 10/14/2017. FINDINGS: Comment: Portions of today's examination are limited by artifact related to considerable patient motion. Lower chest: Small bilateral pleural effusions lying dependently. Increased signal intensity in the adjacent portions of the lungs likely to represent areas of passive atelectasis. Hepatobiliary: No cystic or solid hepatic lesions. MRCP images are poor quality secondary to patient motion. With this limitation in mind, there is no significant intrahepatic biliary ductal dilatation. Common bile duct appears mildly dilated measuring 9 mm in the porta hepatis. On today's study there is no definite filling defect in the distal common bile duct to to indicate the presence of choledocholithiasis. There are, however, many small filling defects within the lumen of the gallbladder, compatible with cholelithiasis. Gallbladder is nearly completely decompressed. Gallbladder does not appear thickened. Pancreas: No pancreatic mass. No pancreatic ductal dilatation. Pancreatic parenchyma appears to enhance normally. Extensive inflammatory changes  of are again noted adjacent to the distal body and tail of the pancreas, with increasing peripancreatic and retroperitoneal fluid. There is one well-defined fluid collection which extends cephalad from the tail of the pancreas coming in close contact with the undersurface of the proximal to mid stomach, most compatible with a developing pancreatic pseudocyst. This  currently measures 6.2 x 7.6 x 5.2 cm (axial image 39 of series 18 and coronal image 58 of series 22). Spleen:  Unremarkable. Adrenals/Urinary Tract: Bilateral kidneys and bilateral adrenal glands are normal in appearance. No hydroureteronephrosis in the visualized portions of the abdomen. Stomach/Bowel: Enlarging pancreatic pseudocyst intimately associated with the undersurface of the proximal to mid stomach, as discussed above. Visualized portions are otherwise unremarkable. Vascular/Lymphatic: No aneurysm identified in the visualized abdominal vasculature. Splenic vein remains patent at this time. No lymphadenopathy noted in the abdomen. Other: Small volume of ascites. Increasing retroperitoneal fluid, predominantly T1 hypointense and T2 hyperintense. The exception to this is some T1 hyperintense fluid tracking along the left retroperitoneum immediately inferior to the tail of the pancreas (axial image 71 of series 15), which could indicate some proteinaceous and/or hemorrhagic contents. Musculoskeletal: No aggressive appearing osseous lesions are noted in the visualized portions of the skeleton. IMPRESSION: 1. Worsening acute pancreatitis, with enlarging pancreatic pseudocyst which is intimately associated with the undersurface of the proximal to mid stomach, an increasing inflammatory changes and fluid throughout the retroperitoneum, some of which appears proteinaceous and/or hemorrhagic, as discussed above. 2. No definite signs of pancreatic necrosis noted at this time. 3. Cholelithiasis. No definite evidence to suggest an acute cholecystitis or choledocholithiasis. Common bile duct is very mildly dilated measuring up to 9 mm in the porta hepatis. No intrahepatic biliary ductal dilatation. 4. Splenic vein remains patent. 5. Small volume of ascites. 6. Small bilateral pleural effusions with areas of passive atelectasis in the lower lobes of lungs bilaterally. Electronically Signed   By: Trudie Reedaniel  Entrikin M.D.    On: 10/23/2017 11:36        Scheduled Meds: . pantoprazole (PROTONIX) IV  40 mg Intravenous Q12H   Continuous Infusions: . 0.9 % NaCl with KCl 20 mEq / L 175 mL/hr at 10/24/17 1420     LOS: 2 days    Time spent: 25 minutes. Greater than 50% of this time was spent in direct contact with the patient coordinating care.     Chaya JanEstela Hernandez Acosta, MD [facility] Pager [phone]  If 7PM-7AM, please contact night-coverage [email or web address] Password [email or web address] 10/24/2017, 4:50 PM

## 2017-10-24 NOTE — Care Management Important Message (Signed)
Important Message  Patient Details  Name: Rick Velazquez E Kronenberger MRN: [id number] Date of Birth: 05/12/1971   Medicare Important Message Given:  Yes    Abdou Stocks, Chrystine OilerSharley Diane, RN 10/24/2017, 11:25 AM

## 2017-10-24 NOTE — Progress Notes (Signed)
  Subjective:  Patient states "I am a whole lot better".  He feels he is getting good relief with pain medication.  He is having less heartburn.  He denies nausea vomiting melena or rectal bleeding.  He is passing flatus.  He has hungry.  Objective: Blood pressure 135/81, pulse (!) 106, temperature 98.2 F (36.8 C), temperature source Oral, resp. rate 20, height 5\' 9"  (1.753 m), weight 160 lb (72.6 kg), SpO2 92 %. Patient is alert and appears to be in no acute distress. Conjunctiva is pink. Sclera is icteric Oropharyngeal mucosa is normal. No neck masses or thyromegaly noted. Cardiac exam with regular rhythm normal S1 and S2. No murmur or gallop noted. Lungs are clear to auscultation. Abdomen is full.  Bowel sounds are normal.  On palpation abdomen is soft across lower half.  It is full tender in epigastric region.  There may be some guarding but no rebound. No LE edema or clubbing noted.  Urine output 107 5 mL last 24 hours.  Labs/studies Results:  Recent Labs    10/22/17 0728 10/23/17 0500 10/24/17 0433  WBC 10.9* 11.9* 8.0  HGB 14.7 15.6 12.8*  HCT 45.0 48.6 40.6  PLT 215 234 200    BMET  Recent Labs    10/22/17 0728 10/23/17 0500 10/24/17 0433  NA 138 138 136  K 4.1 4.3 3.9  CL 107 107 106  CO2 22 22 22   GLUCOSE 138* 147* 129*  BUN 11 16 17   CREATININE 0.87 0.88 0.81  CALCIUM 8.3* 8.1* 7.9*    LFT  Recent Labs    10/22/17 0728 10/23/17 0500 10/24/17 0433  PROT 6.3* 5.8* 5.6*  ALBUMIN 3.4* 2.8* 2.5*  AST 80* 31 18  ALT 153* 91* 46  ALKPHOS 262* 199* 115  BILITOT 4.7* 6.4* 3.1*  BILIDIR 2.5*  --   --     Telemetry reveals sinus tachycardia.  CT and MR images shared with patient.  Assessment:  #1.  Biliary pancreatitis complicated by pseudocyst formation posterior to stomach close to pancreatic tail and body.  Bilirubin is down.  MRCP negative for choledocholithiasis.  There is no doubt that he has passed stones spontaneously.  He has multiple small  stones in gallbladder well seen on all 3 imaging studies.  Patient is feeling better but he is not out of the woods yet.  He does not have any respiratory or renal complications. The patient's serum calcium is low but elected for low albumin it is 9.10 which is normal. Patient will need surgical consultation for cholecystectomy when he has recovered from pancreatitis.  Recommendations:  Continue IV fluids at current rate of 175 mL/h. Clear liquids.  Patient advised to let us know if he has nausea or abdominal pain gets worse. CBC and metabolic 7 in a.m.

## 2017-10-24 NOTE — Progress Notes (Signed)
Patient ID: Rick Velazquez, male   DOB: 04/19/1971, 46 y.o.   MRN: [id number] Feels somewhat abetter. No BM in 2 days. Bilirubin, AST, ALP and ALT coming down. Still has abdominal pain.  BS are sluggish. Abdomen is full.  Diffuse tenderness. Assessment. Biliary pancreatitis. Plan; Will continue to monitor.

## 2017-10-25 DIAGNOSIS — K851 Biliary acute pancreatitis without necrosis or infection: Secondary | ICD-10-CM

## 2017-10-25 DIAGNOSIS — K8050 Calculus of bile duct without cholangitis or cholecystitis without obstruction: Secondary | ICD-10-CM

## 2017-10-25 LAB — COMPREHENSIVE METABOLIC PANEL
ALBUMIN: 2.4 g/dL — AB (ref 3.5–5.0)
ALT: 32 U/L (ref 17–63)
AST: 19 U/L (ref 15–41)
Alkaline Phosphatase: 91 U/L (ref 38–126)
Anion gap: 8 (ref 5–15)
BILIRUBIN TOTAL: 3.2 mg/dL — AB (ref 0.3–1.2)
BUN: 13 mg/dL (ref 6–20)
CHLORIDE: 105 mmol/L (ref 101–111)
CO2: 22 mmol/L (ref 22–32)
Calcium: 7.9 mg/dL — ABNORMAL LOW (ref 8.9–10.3)
Creatinine, Ser: 0.64 mg/dL (ref 0.61–1.24)
GFR calc Af Amer: >60 mL/min (ref >60)
GFR calc non Af Amer: >60 mL/min (ref >60)
GLUCOSE: 106 mg/dL — AB (ref 65–99)
POTASSIUM: 3.4 mmol/L — AB (ref 3.5–5.1)
Sodium: 135 mmol/L (ref 135–145)
Total Protein: 5.8 g/dL — ABNORMAL LOW (ref 6.5–8.1)

## 2017-10-25 LAB — CBC
HEMATOCRIT: 35.5 % — AB (ref 39.0–52.0)
Hemoglobin: 11.6 g/dL — ABNORMAL LOW (ref 13.0–17.0)
MCH: 30.8 pg (ref 26.0–34.0)
MCHC: 32.7 g/dL (ref 30.0–36.0)
MCV: 94.2 fL (ref 78.0–100.0)
PLATELETS: 212 10*3/uL (ref 150–400)
RBC: 3.77 MIL/uL — AB (ref 4.22–5.81)
RDW: 14.6 % (ref 11.5–15.5)
WBC: 7.7 10*3/uL (ref 4.0–10.5)

## 2017-10-25 MED ORDER — POTASSIUM CHLORIDE CRYS ER 20 MEQ PO TBCR
40.0000 meq | EXTENDED_RELEASE_TABLET | Freq: Once | ORAL | Status: AC
  Administered 2017-10-25: 40 meq via ORAL
  Filled 2017-10-25: qty 2

## 2017-10-25 MED ORDER — IBUPROFEN 600 MG PO TABS
600.0000 mg | ORAL_TABLET | Freq: Once | ORAL | Status: AC
  Administered 2017-10-25: 600 mg via ORAL
  Filled 2017-10-25: qty 1

## 2017-10-25 NOTE — Progress Notes (Signed)
PROGRESS NOTE    Rick Velazquez  ZOX:[id number] DOB: 06/13/1971 DOA: 10/21/2017 PCP: System, Pcp Not In     Brief Narrative:  46 year old man admitted on 11/27 after presenting to the ED with complaints of abdominal pain.  He was found to be jaundiced with elevated LFTs and admission was requested.   Assessment & Plan:   Principal Problem:   Acute gallstone pancreatitis Active Problems:   Hypokalemia   Tobacco use   Choledocholithiasis   Acute gallstone pancreatitis -Patient is clinically improved, tolerated clears without issue. -Diet advanced to full liquids as per GI. -GI is on board. -There is evidence of pseudocyst on MRI.  Transaminitis -LFTs and bilirubin are significantly improved today.  Suspect he spontaneously passed a stone that led to pancreatitis. -MRCP without evidence for choledocholithiasis. -We will need surgical evaluation for cholecystectomy as an outpatient once acute episode resolved.  Hypokalemia -Replace orally.     DVT prophylaxis: SCDs Code Status: Full code Family Communication: Patient only Disposition Plan: Anticipate discharge home in 48-72 hours pending medical stability  Consultants:   GI  Procedures:   None  Antimicrobials:  Anti-infectives (From admission, onward)   Start     Dose/Rate Route Frequency Ordered Stop   10/22/17 1730  cefTRIAXone (ROCEPHIN) 1 g in dextrose 5 % 50 mL IVPB  Status:  Discontinued     1 g 100 mL/hr over 30 Minutes Intravenous Every 24 hours 10/22/17 1719 10/23/17 1809       Subjective: Feels much improved, tolerated  clear diet without issues, no nausea or vomiting, only minimal abdominal pain  Objective: Vitals:   10/24/17 1416 10/24/17 2047 10/25/17 0440 10/25/17 1438  BP: 135/81 121/83 (!) 135/93 (!) 123/91  Pulse: (!) 106 (!) 108 (!) 110 (!) 109  Resp: 20 20 20 18   Temp: 98.2 F (36.8 C) 98.1 F (36.7 C) 98.8 F (37.1 C) 99.3 F (37.4 C)  TempSrc: Oral Oral Oral Oral  SpO2: 92%  94% 94% 94%  Weight:      Height:        Intake/Output Summary (Last 24 hours) at 10/25/2017 1619 Last data filed at 10/25/2017 1443 Gross per 24 hour  Intake 1929 ml  Output 1150 ml  Net 779 ml   Filed Weights   10/21/17 2050  Weight: 72.6 kg (160 lb)    Examination:  General exam: Alert, awake, oriented x 3 Respiratory system: Clear to auscultation. Respiratory effort normal. Cardiovascular system:RRR. No murmurs, rubs, gallops. Gastrointestinal system: Abdomen is nondistended, soft and nontender. No organomegaly or masses felt. Normal bowel sounds heard. Central nervous system: Alert and oriented. No focal neurological deficits. Extremities: No C/C/E, +pedal pulses Skin: No rashes, lesions or ulcers Psychiatry: Judgement and insight appear normal. Mood & affect appropriate.      Data Reviewed: I have personally reviewed following labs and imaging studies  CBC: Recent Labs  Lab 10/21/17 2104 10/22/17 0728 10/23/17 0500 10/24/17 0433 10/25/17 0613  WBC 10.6* 10.9* 11.9* 8.0 7.7  NEUTROABS 8.1* 9.4* 9.8* 6.2  --   HGB 15.0 14.7 15.6 12.8* 11.6*  HCT 45.1 45.0 48.6 40.6 35.5*  MCV 93.4 94.7 96.6 96.4 94.2  PLT 252 215 234 200 212   Basic Metabolic Panel: Recent Labs  Lab 10/21/17 2104 10/22/17 0728 10/23/17 0500 10/24/17 0433 10/25/17 0613  NA 135 138 138 136 135  K 3.2* 4.1 4.3 3.9 3.4*  CL 97* 107 107 106 105  CO2 26 22 22 22 22   GLUCOSE  185* 138* 147* 129* 106*  BUN 12 11 16 17 13   CREATININE 0.78 0.87 0.88 0.81 0.64  CALCIUM 10.0 8.3* 8.1* 7.9* 7.9*  MG 2.0  --   --   --   --    GFR: Estimated Creatinine Clearance: 115.4 mL/min (by C-G formula based on SCr of 0.64 mg/dL). Liver Function Tests: Recent Labs  Lab 10/21/17 2104 10/22/17 0728 10/23/17 0500 10/24/17 0433 10/25/17 0613  AST 131* 80* 31 18 19   ALT 202* 153* 91* 46 32  ALKPHOS 337* 262* 199* 115 91  BILITOT 10.1* 4.7* 6.4* 3.1* 3.2*  PROT 7.6 6.3* 5.8* 5.6* 5.8*  ALBUMIN 4.0  3.4* 2.8* 2.5* 2.4*   Recent Labs  Lab 10/21/17 2104 10/22/17 0728 10/23/17 0500 10/24/17 0433  LIPASE 1,237* 452* 171* 53*   No results for input(s): AMMONIA in the last 168 hours. Coagulation Profile: No results for input(s): INR, PROTIME in the last 168 hours. Cardiac Enzymes: No results for input(s): CKTOTAL, CKMB, CKMBINDEX, TROPONINI in the last 168 hours. BNP (last 3 results) No results for input(s): PROBNP in the last 8760 hours. HbA1C: No results for input(s): HGBA1C in the last 72 hours. CBG: No results for input(s): GLUCAP in the last 168 hours. Lipid Profile: No results for input(s): CHOL, HDL, LDLCALC, TRIG, CHOLHDL, LDLDIRECT in the last 72 hours. Thyroid Function Tests: No results for input(s): TSH, T4TOTAL, FREET4, T3FREE, THYROIDAB in the last 72 hours. Anemia Panel: No results for input(s): VITAMINB12, FOLATE, FERRITIN, TIBC, IRON, RETICCTPCT in the last 72 hours. Urine analysis:    Component Value Date/Time   COLORURINE AMBER (A) 10/21/2017 2110   APPEARANCEUR CLEAR 10/21/2017 2110   LABSPEC 1.023 10/21/2017 2110   PHURINE 5.0 10/21/2017 2110   GLUCOSEU NEGATIVE 10/21/2017 2110   HGBUR SMALL (A) 10/21/2017 2110   BILIRUBINUR MODERATE (A) 10/21/2017 2110   KETONESUR NEGATIVE 10/21/2017 2110   PROTEINUR 30 (A) 10/21/2017 2110   NITRITE NEGATIVE 10/21/2017 2110   LEUKOCYTESUR NEGATIVE 10/21/2017 2110   Sepsis Labs: @LABRCNTIP (procalcitonin:4,lacticidven:4)  )No results found for this or any previous visit (from the past 240 hour(s)).       Radiology Studies: No results found.      Scheduled Meds: . pantoprazole (PROTONIX) IV  40 mg Intravenous Q12H   Continuous Infusions: . 0.9 % NaCl with KCl 20 mEq / L 175 mL/hr at 10/25/17 1120     LOS: 3 days    Time spent: 25 minutes. Greater than 50% of this time was spent in direct contact with the patient coordinating care.     Chaya JanEstela Hernandez Acosta, MD [facility] Pager  [phone]  If 7PM-7AM, please contact night-coverage [email or web address] Password [email or web address] 10/25/2017, 4:19 PM

## 2017-10-25 NOTE — Progress Notes (Signed)
  Subjective:  Patient states pain across upper abdomen is much less than medications helping.  He had no difficulty with clear liquids.  He is passing flatus.  He denies chest pain or shortness of breath.  He has been ambulating in the room. Objective: Blood pressure (!) 135/93, pulse (!) 110, temperature 98.8 F (37.1 C), temperature source Oral, resp. rate 20, height 5\' 9"  (1.753 m), weight 160 lb (72.6 kg), SpO2 94 %. Patient is alert and in no acute distress. They are remains mildly icteric. Cardiac exam with regular rhythm normal S1 and S2. No murmur or gallop noted. Lungs are clear to auscultation. Abdomen is full.  Bowel sounds are normal.  Mild to moderate tenderness noted in epigastric region as well as below right costal margin.  No organomegaly or masses. No LE edema or clubbing noted.  Urine output 135 0 mL last 24 hours.  Labs/studies Results:  Recent Labs    10/23/17 0500 10/24/17 0433 10/25/17 0613  WBC 11.9* 8.0 7.7  HGB 15.6 12.8* 11.6*  HCT 48.6 40.6 35.5*  PLT 234 200 212    BMET  Recent Labs    10/23/17 0500 10/24/17 0433 10/25/17 0613  NA 138 136 135  K 4.3 3.9 3.4*  CL 107 106 105  CO2 22 22 22   GLUCOSE 147* 129* 106*  BUN 16 17 13   CREATININE 0.88 0.81 0.64  CALCIUM 8.1* 7.9* 7.9*    LFT  Recent Labs    10/23/17 0500 10/24/17 0433 10/25/17 0613  PROT 5.8* 5.6* 5.8*  ALBUMIN 2.8* 2.5* 2.4*  AST 31 18 19   ALT 91* 46 32  ALKPHOS 199* 115 91  BILITOT 6.4* 3.1* 3.2*   Assessment:  #1.  Biliary pancreatitis complicated by pseudocyst to its tail of pancreas. Cholestasis has improved.  Imaging studies are negative for choledocholithiasis including MRCP and therefore he has passed stones spontaneously.  Patient has well-preserved renal function and serum calcium is normal when corrected for hypoalbuminemia.  It remains to be seen whether or not he would tolerate oral feeding.  So far so good with clear liquids.  If he fails oral feeding he will  need to be on nasal enteral feeding or TPN.  #2.  Sinus tachycardia secondary to acute pancreatitis.  He does not appear to be dehydrated.   Recommendations:  Advance diet to full liquids. CBC, metabolic-7, serum amylase and lipase in a.m. Continue IV fluids at current rate of 175 mL/h.  If he tolerates oral feeding will back off on IV fluid rate.

## 2017-10-26 LAB — BASIC METABOLIC PANEL
ANION GAP: 5 (ref 5–15)
BUN: 9 mg/dL (ref 6–20)
CALCIUM: 8 mg/dL — AB (ref 8.9–10.3)
CO2: 23 mmol/L (ref 22–32)
Chloride: 109 mmol/L (ref 101–111)
Creatinine, Ser: 0.58 mg/dL — ABNORMAL LOW (ref 0.61–1.24)
Glucose, Bld: 98 mg/dL (ref 65–99)
Potassium: 3.7 mmol/L (ref 3.5–5.1)
SODIUM: 137 mmol/L (ref 135–145)

## 2017-10-26 LAB — CBC
HCT: 33.6 % — ABNORMAL LOW (ref 39.0–52.0)
Hemoglobin: 11 g/dL — ABNORMAL LOW (ref 13.0–17.0)
MCH: 31 pg (ref 26.0–34.0)
MCHC: 32.7 g/dL (ref 30.0–36.0)
MCV: 94.6 fL (ref 78.0–100.0)
PLATELETS: 195 10*3/uL (ref 150–400)
RBC: 3.55 MIL/uL — ABNORMAL LOW (ref 4.22–5.81)
RDW: 14.6 % (ref 11.5–15.5)
WBC: 7.3 10*3/uL (ref 4.0–10.5)

## 2017-10-26 LAB — AMYLASE: Amylase: 130 U/L — ABNORMAL HIGH (ref 28–100)

## 2017-10-26 LAB — GLUCOSE, CAPILLARY: GLUCOSE-CAPILLARY: 146 mg/dL — AB (ref 65–99)

## 2017-10-26 LAB — LIPASE, BLOOD: LIPASE: 104 U/L — AB (ref 11–51)

## 2017-10-26 MED ORDER — ALUM & MAG HYDROXIDE-SIMETH 200-200-20 MG/5ML PO SUSP
30.0000 mL | Freq: Four times a day (QID) | ORAL | Status: DC | PRN
  Administered 2017-10-26: 30 mL via ORAL
  Filled 2017-10-26: qty 30

## 2017-10-26 NOTE — Progress Notes (Signed)
PROGRESS NOTE    Rick Velazquez  ZOX:[id number] DOB: 10/08/1971 DOA: 10/21/2017 PCP: System, Pcp Not In     Brief Narrative:  46 year old man admitted on 11/27 after presenting to the ED with complaints of abdominal pain.  He was found to be jaundiced with elevated LFTs and admission was requested.   Assessment & Plan:   Principal Problem:   Acute gallstone pancreatitis Active Problems:   Hypokalemia   Tobacco use   Choledocholithiasis   Acute gallstone pancreatitis -Patient is clinically improved, tolerated clears without issue. -We will continue to advance diet today. -GI is on board. -There is evidence of pseudocyst on MRI.  Transaminitis -LFTs and bilirubin are improved from admission.  Suspect he spontaneously passed a stone that led to pancreatitis. -MRCP without evidence for choledocholithiasis. -We will need surgical evaluation for cholecystectomy as an outpatient once acute episode resolved.  Hypokalemia -Adequately replaced   DVT prophylaxis: SCDs Code Status: Full code Family Communication: Patient only Disposition Plan: Anticipate discharge home in 24-48 hours pending medical stability  Consultants:   GI  Procedures:   None  Antimicrobials:  Anti-infectives (From admission, onward)   Start     Dose/Rate Route Frequency Ordered Stop   10/22/17 1730  cefTRIAXone (ROCEPHIN) 1 g in dextrose 5 % 50 mL IVPB  Status:  Discontinued     1 g 100 mL/hr over 30 Minutes Intravenous Every 24 hours 10/22/17 1719 10/23/17 1809       Subjective: Feels improved, still bloated, tolerated clear diet without issues specifically no pain, nausea, vomiting.  Objective: Vitals:   10/25/17 0440 10/25/17 1438 10/25/17 2134 10/26/17 0449  BP: (!) 135/93 (!) 123/91 120/79 128/83  Pulse: (!) 110 (!) 109 (!) 107 (!) 108  Resp: 20 18 18 18   Temp: 98.8 F (37.1 C) 99.3 F (37.4 C) 98.1 F (36.7 C) 98.3 F (36.8 C)  TempSrc: Oral Oral Oral Oral  SpO2: 94% 94% 96%  97%  Weight:      Height:        Intake/Output Summary (Last 24 hours) at 10/26/2017 1056 Last data filed at 10/26/2017 0809 Gross per 24 hour  Intake 3341.6 ml  Output 2550 ml  Net 791.6 ml   Filed Weights   10/21/17 2050  Weight: 72.6 kg (160 lb)    Examination:  General exam: Alert, awake, oriented x 3 Respiratory system: Clear to auscultation. Respiratory effort normal. Cardiovascular system:RRR. No murmurs, rubs, gallops. Gastrointestinal system: Abdomen is distended, soft and nontender. No organomegaly or masses felt. Normal bowel sounds heard. Central nervous system: Alert and oriented. No focal neurological deficits. Extremities: No C/C/E, +pedal pulses Skin: No rashes, lesions or ulcers Psychiatry: Judgement and insight appear normal. Mood & affect appropriate.        Data Reviewed: I have personally reviewed following labs and imaging studies  CBC: Recent Labs  Lab 10/21/17 2104 10/22/17 0728 10/23/17 0500 10/24/17 0433 10/25/17 0613 10/26/17 0517  WBC 10.6* 10.9* 11.9* 8.0 7.7 7.3  NEUTROABS 8.1* 9.4* 9.8* 6.2  --   --   HGB 15.0 14.7 15.6 12.8* 11.6* 11.0*  HCT 45.1 45.0 48.6 40.6 35.5* 33.6*  MCV 93.4 94.7 96.6 96.4 94.2 94.6  PLT 252 215 234 200 212 195   Basic Metabolic Panel: Recent Labs  Lab 10/21/17 2104 10/22/17 0728 10/23/17 0500 10/24/17 0433 10/25/17 0613 10/26/17 0517  NA 135 138 138 136 135 137  K 3.2* 4.1 4.3 3.9 3.4* 3.7  CL 97* 107 107 106  105 109  CO2 26 22 22 22 22 23   GLUCOSE 185* 138* 147* 129* 106* 98  BUN 12 11 16 17 13 9   CREATININE 0.78 0.87 0.88 0.81 0.64 0.58*  CALCIUM 10.0 8.3* 8.1* 7.9* 7.9* 8.0*  MG 2.0  --   --   --   --   --    GFR: Estimated Creatinine Clearance: 115.4 mL/min (A) (by C-G formula based on SCr of 0.58 mg/dL (L)). Liver Function Tests: Recent Labs  Lab 10/21/17 2104 10/22/17 0728 10/23/17 0500 10/24/17 0433 10/25/17 0613  AST 131* 80* 31 18 19   ALT 202* 153* 91* 46 32  ALKPHOS 337*  262* 199* 115 91  BILITOT 10.1* 4.7* 6.4* 3.1* 3.2*  PROT 7.6 6.3* 5.8* 5.6* 5.8*  ALBUMIN 4.0 3.4* 2.8* 2.5* 2.4*   Recent Labs  Lab 10/21/17 2104 10/22/17 0728 10/23/17 0500 10/24/17 0433 10/26/17 0517  LIPASE 1,237* 452* 171* 53* 104*  AMYLASE  --   --   --   --  130*   No results for input(s): AMMONIA in the last 168 hours. Coagulation Profile: No results for input(s): INR, PROTIME in the last 168 hours. Cardiac Enzymes: No results for input(s): CKTOTAL, CKMB, CKMBINDEX, TROPONINI in the last 168 hours. BNP (last 3 results) No results for input(s): PROBNP in the last 8760 hours. HbA1C: No results for input(s): HGBA1C in the last 72 hours. CBG: No results for input(s): GLUCAP in the last 168 hours. Lipid Profile: No results for input(s): CHOL, HDL, LDLCALC, TRIG, CHOLHDL, LDLDIRECT in the last 72 hours. Thyroid Function Tests: No results for input(s): TSH, T4TOTAL, FREET4, T3FREE, THYROIDAB in the last 72 hours. Anemia Panel: No results for input(s): VITAMINB12, FOLATE, FERRITIN, TIBC, IRON, RETICCTPCT in the last 72 hours. Urine analysis:    Component Value Date/Time   COLORURINE AMBER (A) 10/21/2017 2110   APPEARANCEUR CLEAR 10/21/2017 2110   LABSPEC 1.023 10/21/2017 2110   PHURINE 5.0 10/21/2017 2110   GLUCOSEU NEGATIVE 10/21/2017 2110   HGBUR SMALL (A) 10/21/2017 2110   BILIRUBINUR MODERATE (A) 10/21/2017 2110   KETONESUR NEGATIVE 10/21/2017 2110   PROTEINUR 30 (A) 10/21/2017 2110   NITRITE NEGATIVE 10/21/2017 2110   LEUKOCYTESUR NEGATIVE 10/21/2017 2110   Sepsis Labs: @LABRCNTIP (procalcitonin:4,lacticidven:4)  )No results found for this or any previous visit (from the past 240 hour(s)).       Radiology Studies: No results found.      Scheduled Meds: . pantoprazole (PROTONIX) IV  40 mg Intravenous Q12H   Continuous Infusions: . 0.9 % NaCl with KCl 20 mEq / L 125 mL/hr at 10/26/17 0820     LOS: 4 days    Time spent: 25 minutes. Greater  than 50% of this time was spent in direct contact with the patient coordinating care.     Chaya JanEstela Hernandez Acosta, MD [facility] Pager [phone]  If 7PM-7AM, please contact night-coverage [email or web address] Password [email or web address] 10/26/2017, 10:56 AM

## 2017-10-26 NOTE — Progress Notes (Signed)
  Subjective:  Patient has had 4 loose stools since seen yesterday he denies rectal bleeding or melena.  He feels bloated but denies abdominal pain.  He has not taken pain medication for over 24 hours.  He did receive single dose of ibuprofen.  He denies nausea.  He states he woke up hungry this morning.  He ate more than half of his breakfast and did not experience pain.  Objective: Blood pressure 128/83, pulse (!) 108, temperature 98.3 F (36.8 C), temperature source Oral, resp. rate 18, height 5\' 9"  (1.753 m), weight 160 lb (72.6 kg), SpO2 97 %. Patient is alert and in no acute distress. Cardiac exam with regular rhythm normal S1 and S2.  No murmur or gallop noted. Lungs clear to auscultation. Abdomen is symmetrical and full across upper region.  Bowel sounds are normal.  On palpation abdomen is soft.  He has mild tenderness on deep palpation in epigastrium to the right of midline. No organomegaly or masses. Peripheral edema noted.  Urine output 2550 mL last 24 hours.  Labs/studies Results:  Recent Labs    10/24/17 0433 10/25/17 0613 10/26/17 0517  WBC 8.0 7.7 7.3  HGB 12.8* 11.6* 11.0*  HCT 40.6 35.5* 33.6*  PLT 200 212 195    BMET  Recent Labs    10/24/17 0433 10/25/17 0613 10/26/17 0517  NA 136 135 137  K 3.9 3.4* 3.7  CL 106 105 109  CO2 22 22 23   GLUCOSE 129* 106* 98  BUN 17 13 9   CREATININE 0.81 0.64 0.58*  CALCIUM 7.9* 7.9* 8.0*    LFT  Recent Labs    10/24/17 0433 10/25/17 0613  PROT 5.6* 5.8*  ALBUMIN 2.5* 2.4*  AST 18 19  ALT 46 32  ALKPHOS 115 91  BILITOT 3.1* 3.2*    Serum amylase 130  Serum lipase 104.  Assessment:  #1.  Biliary pancreatitis complicated by development of pseudocyst.  He is tolerating full liquids.  Slight bump in lipase without relapse of his pain.  Serum amylase is mildly elevated and may be due to pseudocyst. Patient remains with sinus tachycardia but his heart rate has come down. Renal function remains well-preserved  and serum calcium up slightly which is reassuring. Will advance diet and watch closely for worsening signs and symptoms.  #2.  Mild anemia secondary to acute illness.   Recommendations:  Decrease IV fluid rate to 125 mL/h. Advance diet to low-fat diet.

## 2017-10-27 ENCOUNTER — Encounter (INDEPENDENT_AMBULATORY_CARE_PROVIDER_SITE_OTHER): Payer: Self-pay | Admitting: Internal Medicine

## 2017-10-27 DIAGNOSIS — K851 Biliary acute pancreatitis without necrosis or infection: Principal | ICD-10-CM

## 2017-10-27 LAB — GLUCOSE, CAPILLARY
GLUCOSE-CAPILLARY: 119 mg/dL — AB (ref 65–99)
Glucose-Capillary: 104 mg/dL — ABNORMAL HIGH (ref 65–99)
Glucose-Capillary: 117 mg/dL — ABNORMAL HIGH (ref 65–99)
Glucose-Capillary: 122 mg/dL — ABNORMAL HIGH (ref 65–99)

## 2017-10-27 LAB — COMPREHENSIVE METABOLIC PANEL
ALT: 25 U/L (ref 17–63)
AST: 22 U/L (ref 15–41)
Albumin: 2.2 g/dL — ABNORMAL LOW (ref 3.5–5.0)
Alkaline Phosphatase: 138 U/L — ABNORMAL HIGH (ref 38–126)
Anion gap: 8 (ref 5–15)
BILIRUBIN TOTAL: 2.8 mg/dL — AB (ref 0.3–1.2)
BUN: 8 mg/dL (ref 6–20)
CHLORIDE: 100 mmol/L — AB (ref 101–111)
CO2: 24 mmol/L (ref 22–32)
CREATININE: 0.58 mg/dL — AB (ref 0.61–1.24)
Calcium: 8 mg/dL — ABNORMAL LOW (ref 8.9–10.3)
GFR calc Af Amer: >60 mL/min (ref >60)
GLUCOSE: 116 mg/dL — AB (ref 65–99)
Potassium: 3.8 mmol/L (ref 3.5–5.1)
Sodium: 132 mmol/L — ABNORMAL LOW (ref 135–145)
TOTAL PROTEIN: 5.5 g/dL — AB (ref 6.5–8.1)

## 2017-10-27 LAB — LIPASE, BLOOD: LIPASE: 128 U/L — AB (ref 11–51)

## 2017-10-27 NOTE — Progress Notes (Signed)
REVIEWED. AGREE. NO ADDITIONAL RECOMMENDATIONS.   Subjective: Tolerating diet of soft food. Denies abdominal pain, N/V.   Objective: Vital signs in last 24 hours: Temp:  [98.3 F (36.8 C)-98.9 F (37.2 C)] 98.3 F (36.8 C) (12/03 0600) Pulse Rate:  [88-140] 110 (12/03 0600) Resp:  [18-20] 18 (12/03 0600) BP: (115-144)/(79-90) 115/79 (12/03 0600) SpO2:  [94 %-98 %] 96 % (12/03 0600) Last BM Date: 10/26/17 General:   Alert and oriented, pleasant, appears older than stated age  Head:  Normocephalic and atraumatic. Eyes:  No icterus, sclera clear. Conjuctiva pink.  Abdomen:  Bowel sounds present, distended but soft, no TTP  Neurologic:  Alert and  oriented x4 Psych:  Alert and cooperative. Normal mood and affect.  Intake/Output from previous day: 12/02 0701 - 12/03 0700 In: 1347.9 [I.V.:1347.9] Out: 2250 [Urine:2250] Intake/Output this shift: No intake/output data recorded.  Lab Results: Recent Labs    10/25/17 0613 10/26/17 0517  WBC 7.7 7.3  HGB 11.6* 11.0*  HCT 35.5* 33.6*  PLT 212 195   BMET Recent Labs    10/25/17 0613 10/26/17 0517 10/27/17 0439  NA 135 137 132*  K 3.4* 3.7 3.8  CL 105 109 100*  CO2 22 23 24   GLUCOSE 106* 98 116*  BUN 13 9 8   CREATININE 0.64 0.58* 0.58*  CALCIUM 7.9* 8.0* 8.0*   LFT Recent Labs    10/25/17 0613 10/27/17 0439  PROT 5.8* 5.5*  ALBUMIN 2.4* 2.2*  AST 19 22  ALT 32 25  ALKPHOS 91 138*  BILITOT 3.2* 2.8*    Assessment: 46 year old male admitted with biliary pancreatitis complicated by development of pseudocyst, elevated LFTs secondary to likely spontaneous passage of stones, with plans for surgical evaluation as outpatient for cholecystectomy. Bilirubin continues to improve, transaminases now normal. Lipase fluctuating but clinically he is without abdominal pain and tolerating soft diet. Anticipate discharge home soon with close outpatient follow-up. He will need serial monitoring of pseudocyst as outpatient. Will  also need appointment with surgery for discussion of cholecystectomy when clinically appropriate.  Plan: Continue soft diet, low fat PPI BID Close outpatient follow-up with Dr. Patty Sermonsehman's office: will need serial imaging of pseudocyst Outpatient surgical consultation for elective cholecystectomy   Gelene MinkAnna W. Boone, PhD, ANP-BC [facility]     LOS: 5 days    10/27/2017, 8:15 AM

## 2017-10-27 NOTE — Discharge Summary (Signed)
Physician Discharge Summary  Rick Saltshomas E Lupien KVQ:[id number] DOB: 04/25/1971 DOA: 10/21/2017  PCP: System, Pcp Not In  Admit date: 10/21/2017 Discharge date: 10/27/2017  Time spent: 45 minutes  Recommendations for Outpatient Follow-up:  -Will be discharged home today. -OP follow up with Dr. Karilyn Cotaehman will be requested in 2 weeks.   Discharge Diagnoses:  Principal Problem:   Acute gallstone pancreatitis Active Problems:   Hypokalemia   Tobacco use   Choledocholithiasis   Discharge Condition: Stable and improved  Filed Weights   10/21/17 2050  Weight: 72.6 kg (160 lb)    History of present illness:  As per Dr. Robb Matarrtiz on 11/28: Rick Velazquez is a 46 y.o. male with medical history significant of hiatal hernia, tobacco use disorder who is coming to the emergency department with complaints of  several days of epigastric abdominal pain associated with nausea, vomiting and diarrhea.  No fever, no chills.  Patient does not elaborate too much on his symptoms.  He denies alcohol use, except on social occasions.  No fever, no chills, chest pain, dyspnea, palpitations, diaphoresis, pitting edema of the lower extremities, flank pain, dysuria, frequency or hematuria.  Patient is not a very good historian and seemed to be a little bit in shock, when I asked him how long he had been jaundiced.  Apparently he did not know that he was having jaundice.  ED Course: Initial vital signs temperature 97.76F, pulse 105, respirations 18, blood pressure 131/95 mmHg and O2 sat 98% on room air.  He received fluids and analgesics in the emergency department.  His workup shows an urinalysis with small hemoglobinuria and moderate bilirubinuria.  WBC of 10.6 with 77.7 neutrophils, hemoglobin of 15 and platelets of 252.  Chemistry showed lactic acid of 0.99 sodium of 135, potassium 3.2, chloride 97, CO2 26 millimol/L.  Glucose was 185 magnesium 2.0 and total bilirubin 10.1 mg/dL.  Lipase level was 1237.  AST 131,  ALT 202 and alkaline phosphatase 337 u/L.    Hospital Course:   Acute gallstone pancreatitis -Patient is clinically improved, tolerated solid diet without issues. -GI is on board. -There is evidence of pseudocyst on MRI. -Will follow up with GI as an OP.  Transaminitis -LFTs and bilirubin are improved from admission.  Suspect he spontaneously passed a stone that led to pancreatitis. -MRCP without evidence for choledocholithiasis. -We will need surgical evaluation for cholecystectomy as an outpatient once acute episode resolved.  Hypokalemia -Adequately replaced    Procedures:  None   Consultations:  GI  Discharge Instructions  Discharge Instructions    Diet - low sodium heart healthy   Complete by:  As directed    Increase activity slowly   Complete by:  As directed      Allergies as of 10/27/2017   No Known Allergies     Medication List    You have not been prescribed any medications.    No Known Allergies Follow-up Information    Rehman, Joline MaxcyNajeeb U, MD. Schedule an appointment as soon as possible for a visit in 2 weeks.   Specialty:  Gastroenterology Contact information: [address], [address] [place] [address] [phone]            The results of significant diagnostics from this hospitalization (including imaging, microbiology, ancillary and laboratory) are listed below for reference.    Significant Diagnostic Studies: Ct Abdomen Pelvis W Contrast  Result Date: 10/22/2017 CLINICAL DATA:  Generalized abdominal pain with nausea and vomiting EXAM: CT  ABDOMEN AND PELVIS WITH CONTRAST TECHNIQUE: Multidetector CT imaging of the abdomen and pelvis was performed using the standard protocol following bolus administration of intravenous contrast. CONTRAST:  ISOVUE-300 IOPAMIDOL (ISOVUE-300) INJECTION 61% COMPARISON:  None. FINDINGS: Lower chest: Lung bases demonstrate patchy dependent atelectasis. Normal heart size. Small hiatal hernia with  thickening. Hepatobiliary: Mild intra hepatic biliary dilatation. Multiple calcified gallstones. Possible mild gallbladder wall thickening. Enlarged extrahepatic bile duct, measuring up to 11 mm at the head of the pancreas. Possible punctate stones near the duct insertion, series 2, image number 37. Pancreas: Enlarged appearing pancreas. Moderate-to-marked fluid, soft tissue stranding and edema cephalad to the pancreas, suspect for pancreatitis. No focal pancreatic hypoenhancement. Slightly more focal fluid collection adjacent to the fundus of the stomach but without rim enhancement. Spleen: Normal in size without focal abnormality. Adrenals/Urinary Tract: Adrenal glands are unremarkable. Kidneys are normal, without renal calculi, focal lesion, or hydronephrosis. Bladder is unremarkable. Stomach/Bowel: Stomach is nonenlarged. No dilated small bowel. Sigmoid colon diverticula, no definitive inflammation. Normal appendix Vascular/Lymphatic: Aortic atherosclerosis. No aneurysmal dilatation. Normal enhancement of splenic and portal vessels at this time. Reproductive: Prostate is unremarkable. Other: Negative for free air. Small amount of free fluid in the pelvis. Fluid extending inferiorly within the left anterior pararenal space, and anterior to the psoas muscle. Musculoskeletal: Degenerative changes. No acute or suspicious lesion IMPRESSION: 1. Moderate fluid, edema and inflammation within the upper abdomen, felt most consistent with acute pancreatitis. Slightly more focal fluid is seen adjacent to the fundus of the stomach but without rim enhancement at this time. 2. Cholelithiasis. Mild intra hepatic and moderate-to-marked extrahepatic biliary dilatation. Suspect punctate stones within the distal duct. Recommend correlation with LFTs. 3. Small amount of free fluid in the pelvis. 4. Sigmoid colon diverticula without acute inflammation Electronically Signed   By: Jasmine Pang M.D.   On: 10/22/2017 01:25   Mr 3d  Recon At Scanner  Result Date: 10/23/2017 CLINICAL DATA:  46 year old male with history of upper abdominal pain and swelling. Pancreatitis noted on recent CT the abdomen and pelvis there biliary dilatation and findings concerning for potential choledocholithiasis. Followup study. EXAM: MRI ABDOMEN WITHOUT AND WITH CONTRAST (INCLUDING MRCP) TECHNIQUE: Multiplanar multisequence MR imaging of the abdomen was performed both before and after the administration of intravenous contrast. Heavily T2-weighted images of the biliary and pancreatic ducts were obtained, and three-dimensional MRCP images were rendered by post processing. CONTRAST:  15mL MULTIHANCE GADOBENATE DIMEGLUMINE 529 MG/ML IV SOLN COMPARISON:  No prior abdominal MRI. CT the abdomen and pelvis and ultrasound of the abdomen 10/14/2017. FINDINGS: Comment: Portions of today's examination are limited by artifact related to considerable patient motion. Lower chest: Small bilateral pleural effusions lying dependently. Increased signal intensity in the adjacent portions of the lungs likely to represent areas of passive atelectasis. Hepatobiliary: No cystic or solid hepatic lesions. MRCP images are poor quality secondary to patient motion. With this limitation in mind, there is no significant intrahepatic biliary ductal dilatation. Common bile duct appears mildly dilated measuring 9 mm in the porta hepatis. On today's study there is no definite filling defect in the distal common bile duct to to indicate the presence of choledocholithiasis. There are, however, many small filling defects within the lumen of the gallbladder, compatible with cholelithiasis. Gallbladder is nearly completely decompressed. Gallbladder does not appear thickened. Pancreas: No pancreatic mass. No pancreatic ductal dilatation. Pancreatic parenchyma appears to enhance normally. Extensive inflammatory changes of are again noted adjacent to the distal body and tail of the  pancreas, with  increasing peripancreatic and retroperitoneal fluid. There is one well-defined fluid collection which extends cephalad from the tail of the pancreas coming in close contact with the undersurface of the proximal to mid stomach, most compatible with a developing pancreatic pseudocyst. This currently measures 6.2 x 7.6 x 5.2 cm (axial image 39 of series 18 and coronal image 58 of series 22). Spleen:  Unremarkable. Adrenals/Urinary Tract: Bilateral kidneys and bilateral adrenal glands are normal in appearance. No hydroureteronephrosis in the visualized portions of the abdomen. Stomach/Bowel: Enlarging pancreatic pseudocyst intimately associated with the undersurface of the proximal to mid stomach, as discussed above. Visualized portions are otherwise unremarkable. Vascular/Lymphatic: No aneurysm identified in the visualized abdominal vasculature. Splenic vein remains patent at this time. No lymphadenopathy noted in the abdomen. Other: Small volume of ascites. Increasing retroperitoneal fluid, predominantly T1 hypointense and T2 hyperintense. The exception to this is some T1 hyperintense fluid tracking along the left retroperitoneum immediately inferior to the tail of the pancreas (axial image 71 of series 15), which could indicate some proteinaceous and/or hemorrhagic contents. Musculoskeletal: No aggressive appearing osseous lesions are noted in the visualized portions of the skeleton. IMPRESSION: 1. Worsening acute pancreatitis, with enlarging pancreatic pseudocyst which is intimately associated with the undersurface of the proximal to mid stomach, an increasing inflammatory changes and fluid throughout the retroperitoneum, some of which appears proteinaceous and/or hemorrhagic, as discussed above. 2. No definite signs of pancreatic necrosis noted at this time. 3. Cholelithiasis. No definite evidence to suggest an acute cholecystitis or choledocholithiasis. Common bile duct is very mildly dilated measuring up to 9 mm  in the porta hepatis. No intrahepatic biliary ductal dilatation. 4. Splenic vein remains patent. 5. Small volume of ascites. 6. Small bilateral pleural effusions with areas of passive atelectasis in the lower lobes of lungs bilaterally. Electronically Signed   By: Trudie Reed M.D.   On: 10/23/2017 11:36   Mr Abdomen Mrcp Vivien Rossetti Contast  Result Date: 10/23/2017 CLINICAL DATA:  46 year old male with history of upper abdominal pain and swelling. Pancreatitis noted on recent CT the abdomen and pelvis there biliary dilatation and findings concerning for potential choledocholithiasis. Followup study. EXAM: MRI ABDOMEN WITHOUT AND WITH CONTRAST (INCLUDING MRCP) TECHNIQUE: Multiplanar multisequence MR imaging of the abdomen was performed both before and after the administration of intravenous contrast. Heavily T2-weighted images of the biliary and pancreatic ducts were obtained, and three-dimensional MRCP images were rendered by post processing. CONTRAST:  15mL MULTIHANCE GADOBENATE DIMEGLUMINE 529 MG/ML IV SOLN COMPARISON:  No prior abdominal MRI. CT the abdomen and pelvis and ultrasound of the abdomen 10/14/2017. FINDINGS: Comment: Portions of today's examination are limited by artifact related to considerable patient motion. Lower chest: Small bilateral pleural effusions lying dependently. Increased signal intensity in the adjacent portions of the lungs likely to represent areas of passive atelectasis. Hepatobiliary: No cystic or solid hepatic lesions. MRCP images are poor quality secondary to patient motion. With this limitation in mind, there is no significant intrahepatic biliary ductal dilatation. Common bile duct appears mildly dilated measuring 9 mm in the porta hepatis. On today's study there is no definite filling defect in the distal common bile duct to to indicate the presence of choledocholithiasis. There are, however, many small filling defects within the lumen of the gallbladder, compatible with  cholelithiasis. Gallbladder is nearly completely decompressed. Gallbladder does not appear thickened. Pancreas: No pancreatic mass. No pancreatic ductal dilatation. Pancreatic parenchyma appears to enhance normally. Extensive inflammatory changes of are again noted adjacent to  the distal body and tail of the pancreas, with increasing peripancreatic and retroperitoneal fluid. There is one well-defined fluid collection which extends cephalad from the tail of the pancreas coming in close contact with the undersurface of the proximal to mid stomach, most compatible with a developing pancreatic pseudocyst. This currently measures 6.2 x 7.6 x 5.2 cm (axial image 39 of series 18 and coronal image 58 of series 22). Spleen:  Unremarkable. Adrenals/Urinary Tract: Bilateral kidneys and bilateral adrenal glands are normal in appearance. No hydroureteronephrosis in the visualized portions of the abdomen. Stomach/Bowel: Enlarging pancreatic pseudocyst intimately associated with the undersurface of the proximal to mid stomach, as discussed above. Visualized portions are otherwise unremarkable. Vascular/Lymphatic: No aneurysm identified in the visualized abdominal vasculature. Splenic vein remains patent at this time. No lymphadenopathy noted in the abdomen. Other: Small volume of ascites. Increasing retroperitoneal fluid, predominantly T1 hypointense and T2 hyperintense. The exception to this is some T1 hyperintense fluid tracking along the left retroperitoneum immediately inferior to the tail of the pancreas (axial image 71 of series 15), which could indicate some proteinaceous and/or hemorrhagic contents. Musculoskeletal: No aggressive appearing osseous lesions are noted in the visualized portions of the skeleton. IMPRESSION: 1. Worsening acute pancreatitis, with enlarging pancreatic pseudocyst which is intimately associated with the undersurface of the proximal to mid stomach, an increasing inflammatory changes and fluid  throughout the retroperitoneum, some of which appears proteinaceous and/or hemorrhagic, as discussed above. 2. No definite signs of pancreatic necrosis noted at this time. 3. Cholelithiasis. No definite evidence to suggest an acute cholecystitis or choledocholithiasis. Common bile duct is very mildly dilated measuring up to 9 mm in the porta hepatis. No intrahepatic biliary ductal dilatation. 4. Splenic vein remains patent. 5. Small volume of ascites. 6. Small bilateral pleural effusions with areas of passive atelectasis in the lower lobes of lungs bilaterally. Electronically Signed   By: Trudie Reed M.D.   On: 10/23/2017 11:36   US Abdomen Limited Ruq  Result Date: 10/22/2017 CLINICAL DATA:  Epigastric pain EXAM: ULTRASOUND ABDOMEN LIMITED RIGHT UPPER QUADRANT COMPARISON:  CT earlier today FINDINGS: Gallbladder: Multiple gallstones are present. The largest gallstone is 0.8 cm in diameter. No wall thickening. No pericholecystic fluid. There is sludge within the gallbladder. Tiny hyperechoic structures in the gallbladder lumen likely represents additional tiny cholesterol stones. Negative Murphy sign. Common bile duct: Diameter: The common bile duct is dilated at 11 mm. Liver: The liver is diffusely heterogeneous without focal mass. Intrahepatic biliary dilatation is suspected. Portal vein is patent on color Doppler imaging with normal direction of blood flow towards the liver. Additional finding: There is a small amount of free fluid between the right kidney and liver in the Morison's pouch. IMPRESSION: Cholelithiasis. Intra and extrahepatic biliary dilatation suggest distal biliary obstruction. MRCP or ERCP can be performed to further delineate. Heterogeneous liver. Diffuse hepatic parenchymal disease cannot be excluded. Correlation with liver function tests is recommended. Trace free-fluid. Electronically Signed   By: Jolaine Click M.D.   On: 10/22/2017 09:39    Microbiology: No results found for this  or any previous visit (from the past 240 hour(s)).   Labs: Basic Metabolic Panel: Recent Labs  Lab 10/21/17 2104  10/23/17 0500 10/24/17 0433 10/25/17 0613 10/26/17 0517 10/27/17 0439  NA 135   < > 138 136 135 137 132*  K 3.2*   < > 4.3 3.9 3.4* 3.7 3.8  CL 97*   < > 107 106 105 109 100*  CO2 26   < >  22 22 22 23 24   GLUCOSE 185*   < > 147* 129* 106* 98 116*  BUN 12   < > 16 17 13 9 8   CREATININE 0.78   < > 0.88 0.81 0.64 0.58* 0.58*  CALCIUM 10.0   < > 8.1* 7.9* 7.9* 8.0* 8.0*  MG 2.0  --   --   --   --   --   --    < > = values in this interval not displayed.   Liver Function Tests: Recent Labs  Lab 10/22/17 0728 10/23/17 0500 10/24/17 0433 10/25/17 0613 10/27/17 0439  AST 80* 31 18 19 22   ALT 153* 91* 46 32 25  ALKPHOS 262* 199* 115 91 138*  BILITOT 4.7* 6.4* 3.1* 3.2* 2.8*  PROT 6.3* 5.8* 5.6* 5.8* 5.5*  ALBUMIN 3.4* 2.8* 2.5* 2.4* 2.2*   Recent Labs  Lab 10/22/17 0728 10/23/17 0500 10/24/17 0433 10/26/17 0517 10/27/17 0439  LIPASE 452* 171* 53* 104* 128*  AMYLASE  --   --   --  130*  --    No results for input(s): AMMONIA in the last 168 hours. CBC: Recent Labs  Lab 10/21/17 2104 10/22/17 0728 10/23/17 0500 10/24/17 0433 10/25/17 0613 10/26/17 0517  WBC 10.6* 10.9* 11.9* 8.0 7.7 7.3  NEUTROABS 8.1* 9.4* 9.8* 6.2  --   --   HGB 15.0 14.7 15.6 12.8* 11.6* 11.0*  HCT 45.1 45.0 48.6 40.6 35.5* 33.6*  MCV 93.4 94.7 96.6 96.4 94.2 94.6  PLT 252 215 234 200 212 195   Cardiac Enzymes: No results for input(s): CKTOTAL, CKMB, CKMBINDEX, TROPONINI in the last 168 hours. BNP: BNP (last 3 results) No results for input(s): BNP in the last 8760 hours.  ProBNP (last 3 results) No results for input(s): PROBNP in the last 8760 hours.  CBG: Recent Labs  Lab 10/26/17 2018 10/27/17 0019 10/27/17 0457 10/27/17 0758 10/27/17 1151  GLUCAP 146* 104* 117* 119* 122*       Signed:  Chaya JanEstela Hernandez Acosta  [facility] Pager:  [phone] 10/27/2017, 4:45 PM

## 2017-10-27 NOTE — Progress Notes (Signed)
Patient IV removed, telemetry removed, tolerated well. Patient given discharge instructions.  

## 2017-10-27 NOTE — Care Management Important Message (Signed)
Important Message  Patient Details  Name: Rick Velazquez E Bohlken MRN: [id number] Date of Birth: 09/27/1971   Medicare Important Message Given:  Yes    Karliah Kowalchuk, Chrystine OilerSharley Diane, RN 10/27/2017, 8:47 AM

## 2017-10-28 ENCOUNTER — Telehealth: Payer: Self-pay | Admitting: Internal Medicine

## 2017-10-28 NOTE — Telephone Encounter (Signed)
Pts cousin, caregiver, called me last night - stating pt just dc'd from hospital yesterday (gallstone pancreatits). .  Now w diarrhea since d/c.  Non-bloody.  I recommended symptomatic treatment including  imodium / kaopectate. If it doesn't resolve in next 24 hours pt is to let us know.

## 2017-11-10 ENCOUNTER — Ambulatory Visit (INDEPENDENT_AMBULATORY_CARE_PROVIDER_SITE_OTHER): Payer: Medicare Other | Admitting: Internal Medicine

## 2017-11-10 ENCOUNTER — Encounter (INDEPENDENT_AMBULATORY_CARE_PROVIDER_SITE_OTHER): Payer: Self-pay | Admitting: *Deleted

## 2017-11-10 ENCOUNTER — Encounter (INDEPENDENT_AMBULATORY_CARE_PROVIDER_SITE_OTHER): Payer: Self-pay | Admitting: Internal Medicine

## 2017-11-10 DIAGNOSIS — K851 Biliary acute pancreatitis without necrosis or infection: Secondary | ICD-10-CM | POA: Diagnosis not present

## 2017-11-10 LAB — HEPATIC FUNCTION PANEL
AG Ratio: 1.3 (calc) (ref 1.0–2.5)
ALKALINE PHOSPHATASE (APISO): 315 U/L — AB (ref 40–115)
ALT: 37 U/L (ref 9–46)
AST: 22 U/L (ref 10–40)
Albumin: 3.7 g/dL (ref 3.6–5.1)
Bilirubin, Direct: 0.4 mg/dL — ABNORMAL HIGH (ref 0.0–0.2)
Globulin: 2.9 g/dL (calc) (ref 1.9–3.7)
Indirect Bilirubin: 0.7 mg/dL (calc) (ref 0.2–1.2)
TOTAL PROTEIN: 6.6 g/dL (ref 6.1–8.1)
Total Bilirubin: 1.1 mg/dL (ref 0.2–1.2)

## 2017-11-10 LAB — CBC WITH DIFFERENTIAL/PLATELET
BASOS ABS: 92 {cells}/uL (ref 0–200)
Basophils Relative: 1.3 %
EOS PCT: 3.5 %
Eosinophils Absolute: 249 cells/uL (ref 15–500)
HCT: 35.6 % — ABNORMAL LOW (ref 38.5–50.0)
Hemoglobin: 12 g/dL — ABNORMAL LOW (ref 13.2–17.1)
Lymphs Abs: 2776 cells/uL (ref 850–3900)
MCH: 30.7 pg (ref 27.0–33.0)
MCHC: 33.7 g/dL (ref 32.0–36.0)
MCV: 91 fL (ref 80.0–100.0)
MONOS PCT: 5.9 %
MPV: 9.7 fL (ref 7.5–12.5)
NEUTROS ABS: 3564 {cells}/uL (ref 1500–7800)
NEUTROS PCT: 50.2 %
Platelets: 465 10*3/uL — ABNORMAL HIGH (ref 140–400)
RBC: 3.91 10*6/uL — ABNORMAL LOW (ref 4.20–5.80)
RDW: 13 % (ref 11.0–15.0)
Total Lymphocyte: 39.1 %
WBC mixed population: 419 cells/uL (ref 200–950)
WBC: 7.1 10*3/uL (ref 3.8–10.8)

## 2017-11-10 NOTE — Patient Instructions (Addendum)
Labs and CT. Will do referral for a surgeon once labs and CT are back.  Acute Pancreatitis Acute pancreatitis is a condition in which the pancreas suddenly gets irritated and swollen (has inflammation). The pancreas is a large gland behind the stomach. It makes enzymes that help to digest food. The pancreas also makes hormones that help to control your blood sugar. Acute pancreatitis happens when the enzymes attack the pancreas and damage it. Most attacks last a couple of days and can cause serious problems. Follow these instructions at home: Eating and drinking  Follow instructions from your doctor about diet. You may need to: ? Avoid alcohol. ? Limit how much fat is in your diet.  Eat small meals often. Avoid eating big meals.  Drink enough fluid to keep your pee (urine) clear or pale yellow.  Do not drink alcohol if it caused your condition. General instructions  Take over-the-counter and prescription medicines only as told by your doctor.  Do not use any tobacco products. These include cigarettes, chewing tobacco, and e-cigarettes. If you need help quitting, ask your doctor.  Get plenty of rest.  If directed, check your blood sugar at home as told by your doctor.  Keep all follow-up visits as told by your doctor. This is important. Contact a doctor if:  You do not get better as quickly as expected.  You have new symptoms.  Your symptoms get worse.  You have lasting pain or weakness.  You continue to feel sick to your stomach (nauseous).  You get better and then you have another pain attack.  You have a fever. Get help right away if:  You cannot eat or keep fluids down.  Your pain becomes very bad.  Your skin or the white part of your eyes turns yellow (jaundice).  You throw up (vomit).  You feel dizzy or you pass out (faint).  Your blood sugar is high (over 300 mg/dL). This information is not intended to replace advice given to you by your health care  provider. Make sure you discuss any questions you have with your health care provider. Document Released: 04/29/2008 Document Revised: 04/18/2016 Document Reviewed: 08/15/2015 Elsevier Interactive Patient Education  2018 [employer]

## 2017-11-10 NOTE — Progress Notes (Addendum)
   Subjective:    Patient ID: Rick Velazquez, male    DOB: 01/12/1971, 46 y.o.   MRN: [id number]  HPI Here today for f/u. Admitted to [facility] 10/21/2017 with gallstone pancreatitis. Felt to have past a CBD stone. MRI on 10/23/2017 revealed IMPRESSION: 1. Worsening acute pancreatitis, with enlarging pancreatic pseudocyst which is intimately associated with the undersurface of the proximal to mid stomach, an increasing inflammatory changes and fluid throughout the retroperitoneum, some of which appears proteinaceous and/or hemorrhagic, as discussed above. 2. No definite signs of pancreatic necrosis noted at this time. 3. Cholelithiasis. No definite evidence to suggest an acute cholecystitis or choledocholithiasis. Common bile duct is very mildly dilated measuring up to 9 mm in the porta hepatis. No intrahepatic biliary ductal dilatation. He tells me he is doing good for the most part. He denies any abdominal pain. BMs are moving normal. He is on a low fat diet, low salt diet. No nausea or vomiting. No fever.      Review of Systems Past Medical History:  Diagnosis Date  . History of hiatal hernia     Past Surgical History:  Procedure Laterality Date  . ALVEOLOPLASTY N/A 08/25/2015   Procedure: ALVEOLOPLASTY;  Surgeon: Ocie DoyneScott Jensen, DDS;  Location: [facility] OR;  Service: Oral Surgery;  Laterality: N/A;  . HERNIA REPAIR    . TOOTH EXTRACTION N/A 08/25/2015   Procedure: MULTIPLE EXTRACTIONS OF TEETH  NUMBERS ONE, TWO, FOUR, FIVE, SIX, EIGHT, NINE, TEN, ELEVEN, TWELVE, SIXTEEN, EIGHTEEN, TWENTY, TWENTY-ONE, TWENTY-TWO, TWENTY-SIX, TWENTY-SEVEN, TWENTY-EIGHT, TWENTY-NINE, THIRTY;  Surgeon: Ocie DoyneScott Jensen, DDS;  [facility];  Service: Oral Surgery;  Laterality: N/A;    No Known Allergies  No current outpatient medications on file prior to visit.   No current facility-administered medications on file prior to visit.         Objective:   Physical Exam Blood pressure 120/78, pulse 72,  temperature 98 F (36.7 C), height 5\' 9"  (1.753 m), weight 146 lb 4.8 oz (66.4 kg). Alert and oriented. Skin warm and dry. Oral mucosa is moist.   . Sclera anicteric, conjunctivae is pink. Thyroid not enlarged. No cervical lymphadenopathy. Lungs clear. Heart regular rate and rhythm.  Abdomen is soft. Bowel sounds are positive. No hepatomegaly. No abdominal masses felt. No tenderness.  No edema to lower extremities.           Assessment & Plan:  Biliary pancreatitis. Follow up CT abdomen with CM. Hepatic, cbc and  If labs and CT are stable, will refer to surgeon.

## 2017-11-17 ENCOUNTER — Ambulatory Visit (HOSPITAL_COMMUNITY)
Admission: RE | Admit: 2017-11-17 | Discharge: 2017-11-17 | Disposition: A | Discharge status: Home / Self Care | Payer: Medicare Other | Source: Ambulatory Visit | Attending: Internal Medicine | Admitting: Internal Medicine

## 2017-11-17 DIAGNOSIS — K851 Biliary acute pancreatitis without necrosis or infection: Secondary | ICD-10-CM | POA: Diagnosis not present

## 2017-11-17 DIAGNOSIS — K863 Pseudocyst of pancreas: Secondary | ICD-10-CM | POA: Diagnosis not present

## 2017-11-17 DIAGNOSIS — K8020 Calculus of gallbladder without cholecystitis without obstruction: Secondary | ICD-10-CM | POA: Insufficient documentation

## 2017-11-17 MED ORDER — IOPAMIDOL (ISOVUE-300) INJECTION 61%
100.0000 mL | Freq: Once | INTRAVENOUS | Status: AC | PRN
  Administered 2017-11-17: 100 mL via INTRAVENOUS

## 2018-02-09 ENCOUNTER — Telehealth (INDEPENDENT_AMBULATORY_CARE_PROVIDER_SITE_OTHER): Payer: Self-pay | Admitting: Internal Medicine

## 2018-02-09 ENCOUNTER — Other Ambulatory Visit (INDEPENDENT_AMBULATORY_CARE_PROVIDER_SITE_OTHER): Payer: Self-pay | Admitting: Internal Medicine

## 2018-02-09 ENCOUNTER — Ambulatory Visit (INDEPENDENT_AMBULATORY_CARE_PROVIDER_SITE_OTHER): Payer: Medicare Other | Admitting: Internal Medicine

## 2018-02-09 DIAGNOSIS — R1010 Upper abdominal pain, unspecified: Secondary | ICD-10-CM

## 2018-02-09 DIAGNOSIS — K863 Pseudocyst of pancreas: Secondary | ICD-10-CM

## 2018-02-09 DIAGNOSIS — R935 Abnormal findings on diagnostic imaging of other abdominal regions, including retroperitoneum: Secondary | ICD-10-CM

## 2018-02-09 NOTE — Telephone Encounter (Signed)
  Ann, CT abdomen/pelvis with CM.  Follow up of pseudocyst and pancreatitis.

## 2018-02-10 NOTE — Telephone Encounter (Signed)
CT sch'd 02/23/18 at 900 (845), pick up contrast, NPO after midnight, patient aware

## 2018-02-11 ENCOUNTER — Ambulatory Visit (INDEPENDENT_AMBULATORY_CARE_PROVIDER_SITE_OTHER): Payer: Medicare Other | Admitting: Internal Medicine

## 2018-02-23 ENCOUNTER — Ambulatory Visit (HOSPITAL_COMMUNITY)
Admission: RE | Admit: 2018-02-23 | Discharge: 2018-02-23 | Disposition: A | Discharge status: Home / Self Care | Payer: Medicare Other | Source: Ambulatory Visit | Attending: Internal Medicine | Admitting: Internal Medicine

## 2018-02-23 DIAGNOSIS — R1010 Upper abdominal pain, unspecified: Secondary | ICD-10-CM | POA: Insufficient documentation

## 2018-02-23 DIAGNOSIS — K8020 Calculus of gallbladder without cholecystitis without obstruction: Secondary | ICD-10-CM | POA: Insufficient documentation

## 2018-02-23 DIAGNOSIS — R935 Abnormal findings on diagnostic imaging of other abdominal regions, including retroperitoneum: Secondary | ICD-10-CM | POA: Insufficient documentation

## 2018-02-23 DIAGNOSIS — I700 Atherosclerosis of aorta: Secondary | ICD-10-CM | POA: Diagnosis not present

## 2018-02-23 DIAGNOSIS — K863 Pseudocyst of pancreas: Secondary | ICD-10-CM | POA: Insufficient documentation

## 2018-02-23 MED ORDER — IOPAMIDOL (ISOVUE-300) INJECTION 61%
100.0000 mL | Freq: Once | INTRAVENOUS | Status: AC | PRN
  Administered 2018-02-23: 100 mL via INTRAVENOUS

## 2018-02-25 ENCOUNTER — Encounter (INDEPENDENT_AMBULATORY_CARE_PROVIDER_SITE_OTHER): Payer: Self-pay | Admitting: Internal Medicine

## 2018-02-25 ENCOUNTER — Ambulatory Visit (INDEPENDENT_AMBULATORY_CARE_PROVIDER_SITE_OTHER): Payer: Medicare Other | Admitting: Internal Medicine

## 2018-04-03 IMAGING — CT CT ABD-PELV W/ CM
2 of 4 series · 16 of 46 positions shown, 18 images · IV contrast (Isovue)
Comparison: CT scan of November 17, 2017.

CLINICAL DATA: Acute upper abdominal pain.

EXAM:
CT ABDOMEN AND PELVIS WITH CONTRAST
TECHNIQUE: Multidetector CT imaging of the abdomen and pelvis was performed
using the standard protocol following bolus administration of
intravenous contrast.
CONTRAST:  100mL [id number] IOPAMIDOL ([id number]) INJECTION 61%

[Series 2: axial st · axial · 0.72mm/px · z∈[+1065,+1475]mm · 13 of 92 slices shown, 15 images]
[im 5/92  soft-tissue]
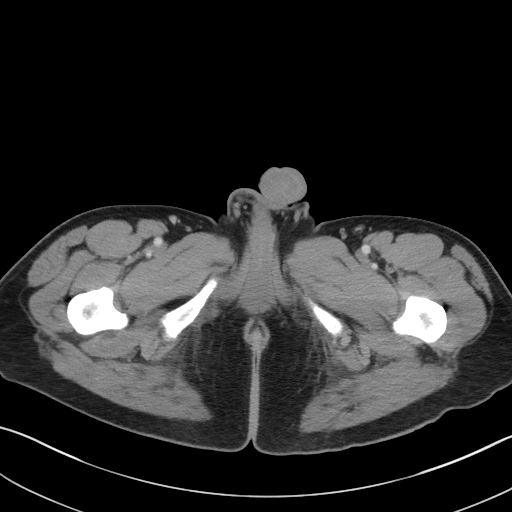
[im 5/92  bone]
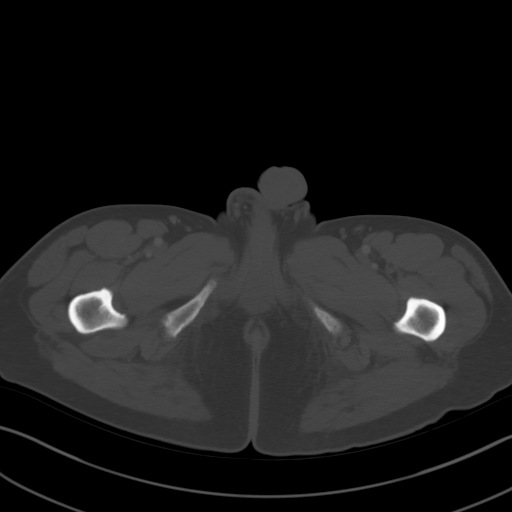
[im 14/92  soft-tissue]
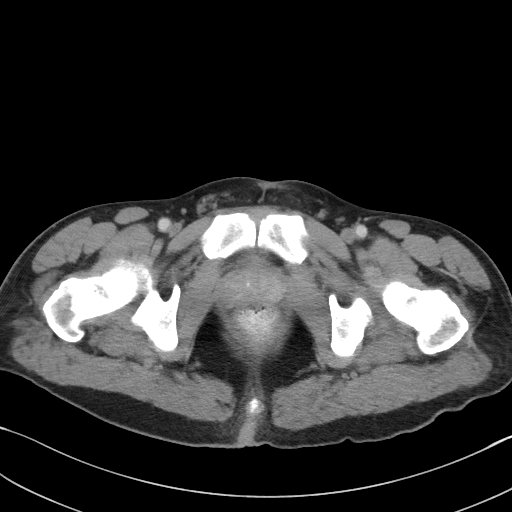
[im 18/92  soft-tissue]
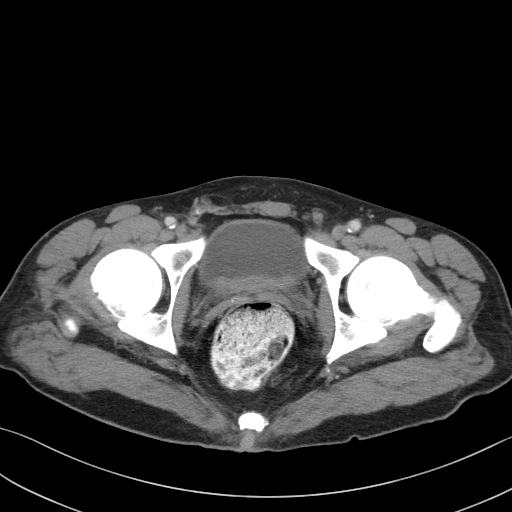
[im 27/92  soft-tissue]
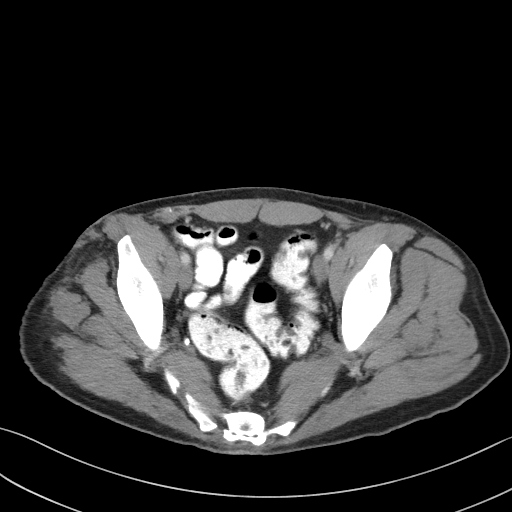
[im 31/92  soft-tissue]
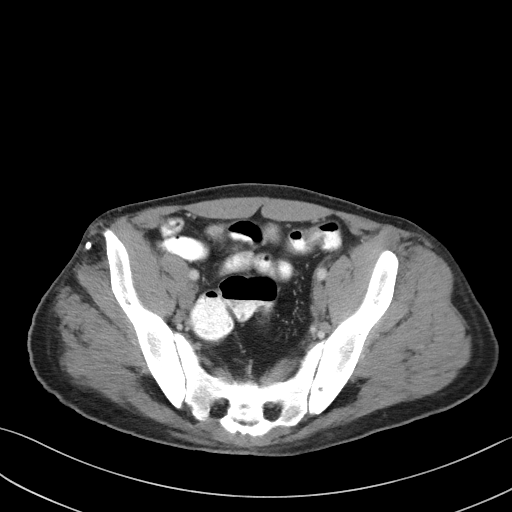
[im 40/92  soft-tissue]
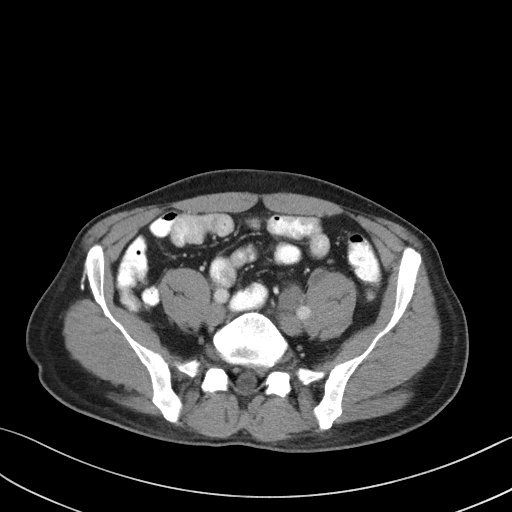
[im 48/92  soft-tissue]
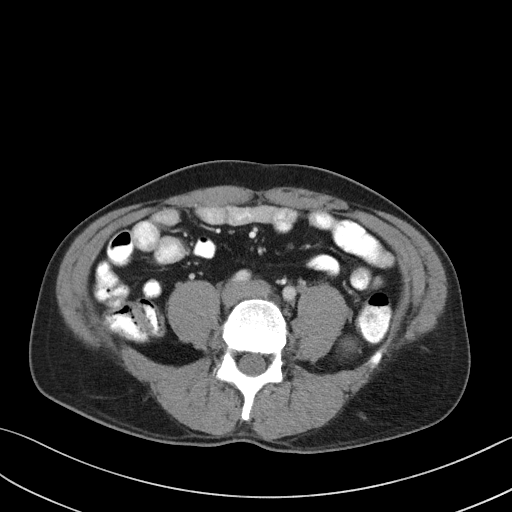
[im 53/92  soft-tissue]
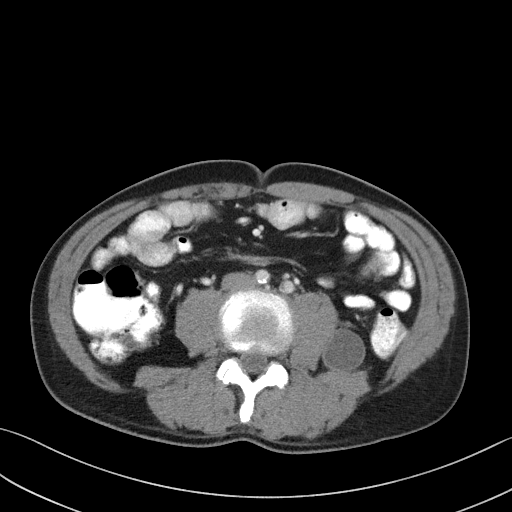
[im 61/92  soft-tissue]
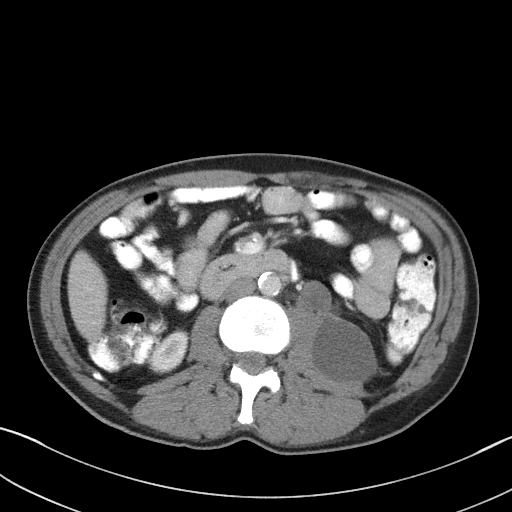
[im 61/92  bone]
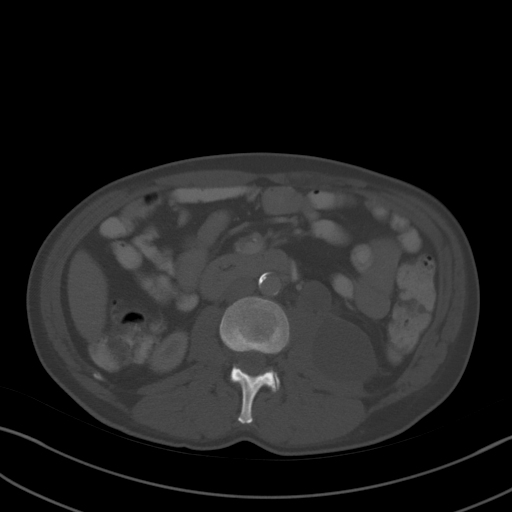
[im 66/92  soft-tissue]
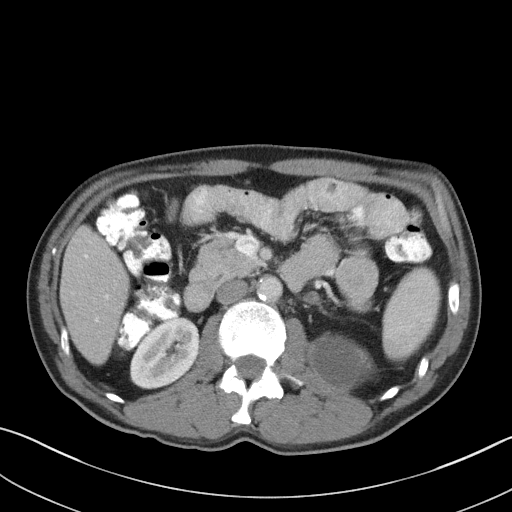
[im 74/92  soft-tissue]
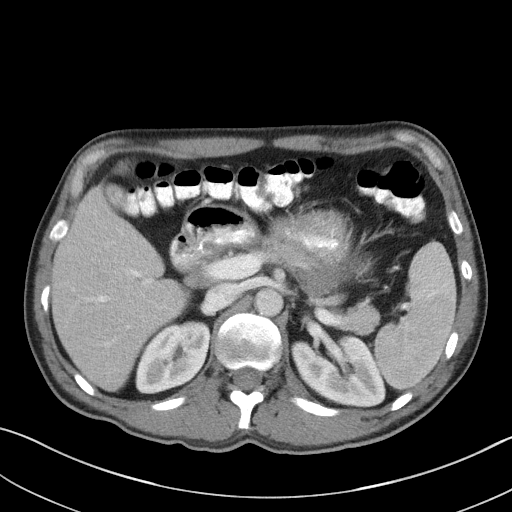
[im 79/92  soft-tissue]
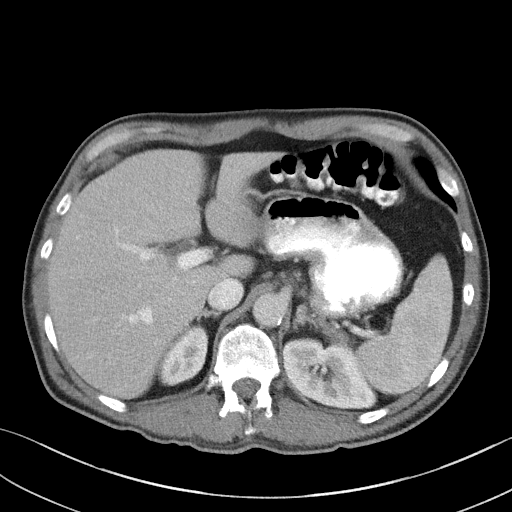
[im 87/92  soft-tissue]
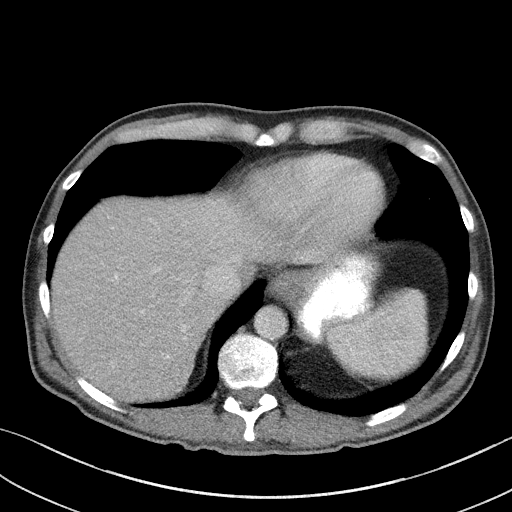

[Series 6: coronal st · coronal · 0.79mm/px · 3 of 91 slices shown]
[im 31/91  soft-tissue]
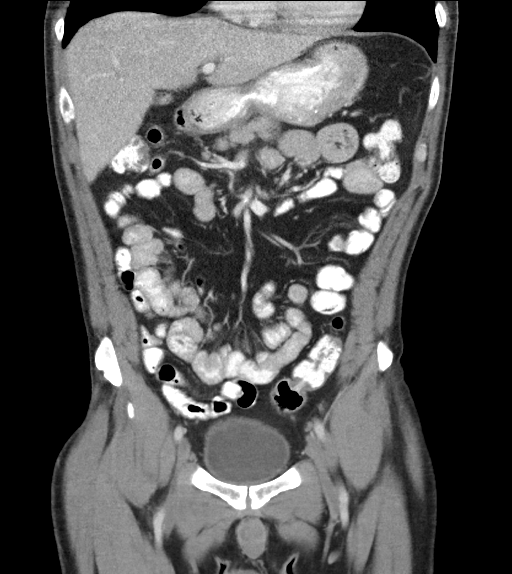
[im 41/91  soft-tissue]
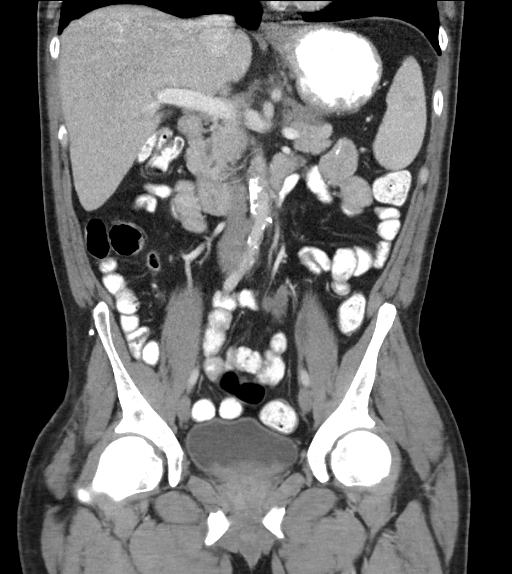
[im 51/91  soft-tissue]
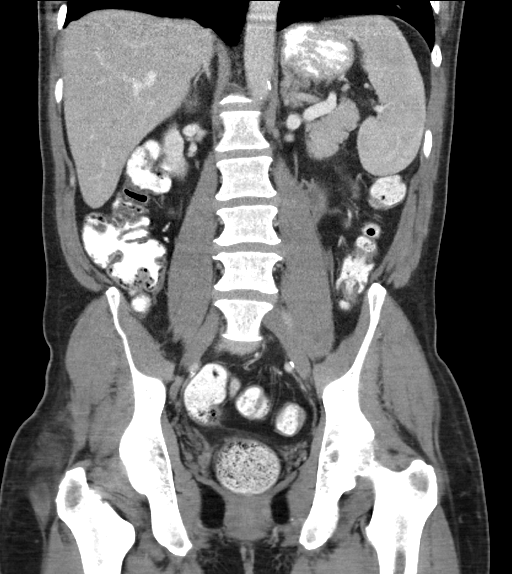

[16 of 46 positions shown; findings below may reference images not displayed]

FINDINGS: Lower chest: No acute abnormality.

Hepatobiliary: No focal liver abnormality is seen. Cholelithiasis is
unchanged. No biliary dilatation is noted.

Pancreas: Unremarkable. No pancreatic ductal dilatation or
surrounding inflammatory changes.

Spleen: Normal in size without focal abnormality.

Adrenals/Urinary Tract: Adrenal glands are unremarkable. Kidneys are
normal, without renal calculi, focal lesion, or hydronephrosis.
Bladder is unremarkable.

Stomach/Bowel: Stomach is within normal limits. Appendix appears
normal. No evidence of bowel wall thickening, distention, or
inflammatory changes.

Vascular/Lymphatic: Aortic atherosclerosis. No enlarged abdominal or
pelvic lymph nodes.

Reproductive: Prostate is unremarkable.

Other: Pseudocysts seen arising superiorly from pancreatic body and
tail on prior exam are significantly smaller currently, measuring
3.4 x 1.8 cm and 2.1 x 1.3 cm respectively. Pseudocyst seen inferior
to left kidney and left pararenal space is also smaller, currently
measuring 5.9 x 4.3 cm. 2.5 x 2.0 cm pseudocyst is seen in the left
retroperitoneal region which is smaller compared to prior exam.

Musculoskeletal: No acute or significant osseous findings.
IMPRESSION: Pancreatic pseudocysts seen on prior exam are significantly smaller
currently. No evidence of acute pancreatic inflammation is noted
currently.

Cholelithiasis without inflammation.

Aortic Atherosclerosis ([id number]).

## 2019-09-19 ENCOUNTER — Encounter (HOSPITAL_COMMUNITY): Payer: Self-pay | Admitting: *Deleted

## 2019-09-19 ENCOUNTER — Observation Stay (HOSPITAL_COMMUNITY)
Admission: AD | Admit: 2019-09-19 | Discharge: 2019-09-20 | Disposition: A | Discharge status: Home / Self Care | Payer: Medicare Other | Source: Other Acute Inpatient Hospital | Attending: Internal Medicine | Admitting: Internal Medicine

## 2019-09-19 ENCOUNTER — Other Ambulatory Visit: Payer: Self-pay

## 2019-09-19 DIAGNOSIS — F17210 Nicotine dependence, cigarettes, uncomplicated: Secondary | ICD-10-CM | POA: Diagnosis not present

## 2019-09-19 DIAGNOSIS — N179 Acute kidney failure, unspecified: Secondary | ICD-10-CM | POA: Insufficient documentation

## 2019-09-19 DIAGNOSIS — Z20828 Contact with and (suspected) exposure to other viral communicable diseases: Secondary | ICD-10-CM | POA: Diagnosis not present

## 2019-09-19 DIAGNOSIS — Z743 Need for continuous supervision: Secondary | ICD-10-CM | POA: Diagnosis not present

## 2019-09-19 DIAGNOSIS — M6282 Rhabdomyolysis: Secondary | ICD-10-CM | POA: Diagnosis not present

## 2019-09-19 DIAGNOSIS — R400 Somnolence: Secondary | ICD-10-CM | POA: Diagnosis not present

## 2019-09-19 DIAGNOSIS — M791 Myalgia, unspecified site: Secondary | ICD-10-CM | POA: Diagnosis present

## 2019-09-19 LAB — COMPREHENSIVE METABOLIC PANEL
ALT: 22 U/L (ref 0–44)
AST: 66 U/L — ABNORMAL HIGH (ref 15–41)
Albumin: 3.8 g/dL (ref 3.5–5.0)
Alkaline Phosphatase: 41 U/L (ref 38–126)
Anion gap: 10 (ref 5–15)
BUN: 18 mg/dL (ref 6–20)
CO2: 19 mmol/L — ABNORMAL LOW (ref 22–32)
Calcium: 8.1 mg/dL — ABNORMAL LOW (ref 8.9–10.3)
Chloride: 110 mmol/L (ref 98–111)
Creatinine, Ser: 0.93 mg/dL (ref 0.61–1.24)
GFR calc Af Amer: >60 mL/min (ref >60)
GFR calc non Af Amer: >60 mL/min (ref >60)
Glucose, Bld: 153 mg/dL — ABNORMAL HIGH (ref 70–99)
Potassium: 3.4 mmol/L — ABNORMAL LOW (ref 3.5–5.1)
Sodium: 139 mmol/L (ref 135–145)
Total Bilirubin: 1.8 mg/dL — ABNORMAL HIGH (ref 0.3–1.2)
Total Protein: 6.2 g/dL — ABNORMAL LOW (ref 6.5–8.1)

## 2019-09-19 LAB — CBC
HCT: 39.7 % (ref 39.0–52.0)
Hemoglobin: 12.7 g/dL — ABNORMAL LOW (ref 13.0–17.0)
MCH: 30.9 pg (ref 26.0–34.0)
MCHC: 32 g/dL (ref 30.0–36.0)
MCV: 96.6 fL (ref 80.0–100.0)
Platelets: 176 10*3/uL (ref 150–400)
RBC: 4.11 MIL/uL — ABNORMAL LOW (ref 4.22–5.81)
RDW: 13.9 % (ref 11.5–15.5)
WBC: 9.5 10*3/uL (ref 4.0–10.5)
nRBC: 0 % (ref 0.0–0.2)

## 2019-09-19 LAB — CK: Total CK: 3736 U/L — ABNORMAL HIGH (ref 49–397)

## 2019-09-19 MED ORDER — POLYETHYLENE GLYCOL 3350 17 G PO PACK: 17.0000 g | PACK | Freq: Every day | ORAL | Status: DC | PRN

## 2019-09-19 MED ORDER — SODIUM CHLORIDE 0.9 % IV SOLN
INTRAVENOUS | Status: DC
  Administered 2019-09-19 – 2019-09-20 (×2): via INTRAVENOUS

## 2019-09-19 MED ORDER — INSULIN ASPART 100 UNIT/ML ~~LOC~~ SOLN: 0.0000 [IU] | Freq: Three times a day (TID) | SUBCUTANEOUS | Status: DC

## 2019-09-19 MED ORDER — ACETAMINOPHEN 650 MG RE SUPP: 650.0000 mg | Freq: Four times a day (QID) | RECTAL | Status: DC | PRN

## 2019-09-19 MED ORDER — NICOTINE 14 MG/24HR TD PT24
14.0000 mg | MEDICATED_PATCH | Freq: Every day | TRANSDERMAL | Status: DC
  Administered 2019-09-19 – 2019-09-20 (×2): 14 mg via TRANSDERMAL
  Filled 2019-09-19 (×2): qty 1

## 2019-09-19 MED ORDER — ONDANSETRON HCL 4 MG PO TABS: 4.0000 mg | ORAL_TABLET | Freq: Four times a day (QID) | ORAL | Status: DC | PRN

## 2019-09-19 MED ORDER — ACETAMINOPHEN 325 MG PO TABS: 650.0000 mg | ORAL_TABLET | Freq: Four times a day (QID) | ORAL | Status: DC | PRN

## 2019-09-19 MED ORDER — POTASSIUM CHLORIDE CRYS ER 20 MEQ PO TBCR
40.0000 meq | EXTENDED_RELEASE_TABLET | Freq: Once | ORAL | Status: AC
  Administered 2019-09-19: 40 meq via ORAL
  Filled 2019-09-19: qty 2

## 2019-09-19 MED ORDER — ONDANSETRON HCL 4 MG/2ML IJ SOLN: 4.0000 mg | Freq: Four times a day (QID) | INTRAMUSCULAR | Status: DC | PRN
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
## 2019-09-20 DIAGNOSIS — F17210 Nicotine dependence, cigarettes, uncomplicated: Secondary | ICD-10-CM | POA: Diagnosis not present

## 2019-09-20 DIAGNOSIS — N179 Acute kidney failure, unspecified: Secondary | ICD-10-CM | POA: Diagnosis not present

## 2019-09-20 DIAGNOSIS — Z20828 Contact with and (suspected) exposure to other viral communicable diseases: Secondary | ICD-10-CM | POA: Diagnosis not present

## 2019-09-20 DIAGNOSIS — M6282 Rhabdomyolysis: Secondary | ICD-10-CM | POA: Diagnosis not present

## 2019-09-20 LAB — COMPREHENSIVE METABOLIC PANEL
ALT: 20 U/L (ref 0–44)
AST: 55 U/L — ABNORMAL HIGH (ref 15–41)
Albumin: 3.2 g/dL — ABNORMAL LOW (ref 3.5–5.0)
Alkaline Phosphatase: 34 U/L — ABNORMAL LOW (ref 38–126)
Anion gap: 8 (ref 5–15)
BUN: 13 mg/dL (ref 6–20)
CO2: 20 mmol/L — ABNORMAL LOW (ref 22–32)
Calcium: 7.9 mg/dL — ABNORMAL LOW (ref 8.9–10.3)
Chloride: 112 mmol/L — ABNORMAL HIGH (ref 98–111)
Creatinine, Ser: 0.76 mg/dL (ref 0.61–1.24)
GFR calc Af Amer: >60 mL/min (ref >60)
GFR calc non Af Amer: >60 mL/min (ref >60)
Glucose, Bld: 93 mg/dL (ref 70–99)
Potassium: 3.7 mmol/L (ref 3.5–5.1)
Sodium: 140 mmol/L (ref 135–145)
Total Bilirubin: 1.2 mg/dL (ref 0.3–1.2)
Total Protein: 5.6 g/dL — ABNORMAL LOW (ref 6.5–8.1)

## 2019-09-20 LAB — HIV ANTIBODY (ROUTINE TESTING W REFLEX): HIV Screen 4th Generation wRfx: NONREACTIVE

## 2019-09-20 LAB — GLUCOSE, CAPILLARY: Glucose-Capillary: 88 mg/dL (ref 70–99)

## 2019-09-20 LAB — CK: Total CK: 2744 U/L — ABNORMAL HIGH (ref 49–397)

## 2019-09-20 NOTE — Progress Notes (Signed)
Nsg Discharge Note  Admit Date:  09/19/2019 Discharge date: 09/20/2019   Steward Ros to be D/C'd Home  per MD order.  AVS completed.  Patient able to verbalize understanding.  Discharge Medication: Allergies as of 09/20/2019   No Known Allergies     Medication List    You have not been prescribed any medications.     Discharge Assessment: Vitals:   09/19/19 2147 09/20/19 0548  BP: 108/72 120/76  Pulse: 92 69  Resp: 16 20  Temp: 98.2 F (36.8 C) 98.5 F (36.9 C)  SpO2: 99% 100%   Skin clean, dry and intact without evidence of skin break down, no evidence of skin tears noted. IV catheter discontinued intact. Site without signs and symptoms of complications - no redness or edema noted at insertion site, patient denies c/o pain - only slight tenderness at site.  Dressing with slight pressure applied.  D/c Instructions-Education: Discharge instructions given to patient with verbalized understanding. D/c education completed with patient including follow up instructions, medication list, d/c activities limitations if indicated, with other d/c instructions as indicated by MD - patient able to verbalize understanding, all questions fully answered. Patient instructed to return to ED, call 911, or call MD for any changes in condition.  Patient escorted via [place], and D/C home via private auto.  Jamieson Hetland Loletha Grayer, RN 09/20/2019 10:56 AM
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
## 2019-09-21 LAB — NOVEL CORONAVIRUS, NAA (HOSP ORDER, SEND-OUT TO REF LAB; TAT 18-24 HRS): SARS-CoV-2, NAA: NOT DETECTED
